# Patient Record
Sex: Female | Born: 1970
Health system: Southern US, Community
[De-identification: ages and names within clinical notes are randomized; demographics above are authoritative.]

## PROBLEM LIST (undated history)

## (undated) ENCOUNTER — Emergency Department (HOSPITAL_COMMUNITY): Admission: EM | Payer: 59 | Source: Home / Self Care

## (undated) DIAGNOSIS — G43909 Migraine, unspecified, not intractable, without status migrainosus: Secondary | ICD-10-CM

## (undated) DIAGNOSIS — I10 Essential (primary) hypertension: Secondary | ICD-10-CM

## (undated) HISTORY — PX: TONSILLECTOMY: SUR1361

## (undated) HISTORY — PX: APPENDECTOMY: SHX54

## (undated) HISTORY — PX: ABDOMINOPLASTY: SUR9

---

## 1998-08-09 ENCOUNTER — Other Ambulatory Visit: Admission: RE | Admit: 1998-08-09 | Discharge: 1998-08-09 | Payer: Self-pay | Admitting: Family Medicine

## 1998-12-03 ENCOUNTER — Encounter: Payer: Self-pay | Admitting: General Surgery

## 1998-12-03 ENCOUNTER — Observation Stay (HOSPITAL_COMMUNITY): Admission: EM | Admit: 1998-12-03 | Discharge: 1998-12-03 | Payer: Self-pay

## 1998-12-07 ENCOUNTER — Ambulatory Visit (HOSPITAL_COMMUNITY): Admission: RE | Admit: 1998-12-07 | Discharge: 1998-12-07 | Payer: Self-pay | Admitting: General Surgery

## 1998-12-07 ENCOUNTER — Encounter: Payer: Self-pay | Admitting: General Surgery

## 1999-09-21 ENCOUNTER — Other Ambulatory Visit: Admission: RE | Admit: 1999-09-21 | Discharge: 1999-09-21 | Payer: Self-pay | Admitting: *Deleted

## 2000-10-14 ENCOUNTER — Other Ambulatory Visit: Admission: RE | Admit: 2000-10-14 | Discharge: 2000-10-14 | Payer: Self-pay | Admitting: Internal Medicine

## 2002-01-01 ENCOUNTER — Other Ambulatory Visit: Admission: RE | Admit: 2002-01-01 | Discharge: 2002-01-01 | Payer: Self-pay | Admitting: Family Medicine

## 2002-11-16 ENCOUNTER — Other Ambulatory Visit: Admission: RE | Admit: 2002-11-16 | Discharge: 2002-11-16 | Payer: Self-pay | Admitting: Obstetrics & Gynecology

## 2003-09-14 ENCOUNTER — Other Ambulatory Visit: Admission: RE | Admit: 2003-09-14 | Discharge: 2003-09-14 | Payer: Self-pay | Admitting: Obstetrics & Gynecology

## 2004-08-28 ENCOUNTER — Other Ambulatory Visit: Admission: RE | Admit: 2004-08-28 | Discharge: 2004-08-28 | Payer: Self-pay | Admitting: Obstetrics & Gynecology

## 2005-04-20 ENCOUNTER — Inpatient Hospital Stay (HOSPITAL_COMMUNITY): Admission: AD | Admit: 2005-04-20 | Discharge: 2005-04-24 | Payer: Self-pay | Admitting: Obstetrics & Gynecology

## 2005-08-13 ENCOUNTER — Other Ambulatory Visit: Admission: RE | Admit: 2005-08-13 | Discharge: 2005-08-13 | Payer: Self-pay | Admitting: Obstetrics and Gynecology

## 2007-01-09 ENCOUNTER — Ambulatory Visit (HOSPITAL_COMMUNITY): Admission: RE | Admit: 2007-01-09 | Discharge: 2007-01-09 | Payer: Self-pay | Admitting: Obstetrics and Gynecology

## 2007-02-01 ENCOUNTER — Inpatient Hospital Stay (HOSPITAL_COMMUNITY): Admission: AD | Admit: 2007-02-01 | Discharge: 2007-02-02 | Payer: Self-pay | Admitting: Obstetrics and Gynecology

## 2007-02-23 ENCOUNTER — Inpatient Hospital Stay (HOSPITAL_COMMUNITY): Admission: AD | Admit: 2007-02-23 | Discharge: 2007-02-23 | Payer: Self-pay | Admitting: Obstetrics & Gynecology

## 2007-03-12 ENCOUNTER — Inpatient Hospital Stay (HOSPITAL_COMMUNITY): Admission: RE | Admit: 2007-03-12 | Discharge: 2007-03-14 | Payer: Self-pay | Admitting: Obstetrics and Gynecology

## 2008-04-15 ENCOUNTER — Encounter: Payer: Self-pay | Admitting: General Surgery

## 2008-04-15 ENCOUNTER — Ambulatory Visit (HOSPITAL_COMMUNITY): Admission: RE | Admit: 2008-04-15 | Discharge: 2008-04-15 | Payer: Self-pay | Admitting: General Surgery

## 2009-07-14 ENCOUNTER — Emergency Department (HOSPITAL_COMMUNITY): Admission: EM | Admit: 2009-07-14 | Discharge: 2009-07-15 | Payer: Self-pay | Admitting: Emergency Medicine

## 2010-02-24 ENCOUNTER — Emergency Department (HOSPITAL_BASED_OUTPATIENT_CLINIC_OR_DEPARTMENT_OTHER): Admission: EM | Admit: 2010-02-24 | Discharge: 2010-02-24 | Payer: Self-pay | Admitting: Emergency Medicine

## 2011-02-20 NOTE — Op Note (Signed)
NAMETENISHA, FLEECE               ACCOUNT NO.:  [id number]   MEDICAL RECORD NO.:  [id number]          PATIENT TYPE:  AMB   LOCATION:  SDS                          FACILITY:  [facility]   PHYSICIAN:  Cherylynn Ridges, M.D.    DATE OF BIRTH:  Mar 28, 1971   DATE OF PROCEDURE:  DATE OF DISCHARGE:                               OPERATIVE REPORT   PREOPERATIVE DIAGNOSIS:  Umbilical hernia.   POSTOPERATIVE DIAGNOSIS:  Umbilical hernia.   PROCEDURE:  Repair of umbilical hernia with UltraPro mesh.   SURGEON:  Cherylynn Ridges, MD   ANESTHESIA:  General with a laryngeal airway.   ESTIMATED BLOOD LOSS:  Less than 20 mL.   COMPLICATIONS:  None.   CONDITION:  Stable.   INDICATIONS FOR OPERATION:  This is a 40 year old with a symptomatic  umbilical hernia who comes in for repair.   OPERATION:  The patient was taken to the operating room and placed on  table in supine position.  After an adequate general laryngeal airway  anesthetic was administered, she was prepped and draped in usual sterile  manner exposing the midline and the periumbilical area.   A 5-6 cm long curved incision underneath the umbilicus was made using  #15 blade and taken down to the subcutaneous tissue.  The most difficult  and time-consuming aspect of this case was dissected away the hernia sac  from the umbilical skin, which was done with a Metzenbaum scissors.  A  small buttonhole was made in the periumbilical and the umbilical skin,  which was repaired internally with three simple stitches of 5-0 Vicryl.   We ultimately dissected the hernia sac away from the skin of the  umbilicus and then circumferentially opened it out into the  preperitoneal space.  We dissect the preperitoneal attachments and also  the omentum, which was inside the hernia away from the fascial edges.  The defect after it was opened completely was about 5 cm long.  Therefore, decision was made to repair with onlay mesh.   We initially repaired the  defect using interrupted simple stitches of #1  Novofil.  We then did a running back-and-forth stitch of 0 Prolene on  top of the interrupted stitch then attached an oval piece of mesh  measuring 3 x 6 cm long of UltraPro mesh, tacking it down with  interrupted stitches of 0 Prolene.  We also attached it in the middle  along the incision line using interrupted 0 Prolene.   Once this was done, we irrigated the wound with antibiotic solution.  The mesh had been soaked in prior to being implanted.  We then closed  the multiple layers and deep of 3-0 Vicryl layer.  We inverted the  umbilicus using 3-0  Vicryl, then we closed the skin after injecting the subcu with 10 mL of  Marcaine without epi.  We closed the skin using a running subcuticular  stitch of 4-0 Monocryl.  Dermabond, Steri-Strips, and Tegaderm were  applied as wound covering.  All counts were correct.      Cherylynn Ridges, M.D.  Electronically Signed  JOW/MEDQ  D:  04/15/2008  T:  04/16/2008  Job:  [id number]   cc:   Maxie Better, M.D.

## 2011-02-23 NOTE — Discharge Summary (Signed)
[name], Karen Dominguez               ACCOUNT NO.:  [id number]   MEDICAL RECORD NO.:  [id number]          PATIENT TYPE:  INP   LOCATION:  9148                          [facility]ILITY[facility]  [facility]   PHYSICIAN:  Miguel Aschoff, M.D.       DATE OF BIRTH:  11-30-1970   DATE OF ADMISSION:  04/20/2005  DATE OF DISCHARGE:  04/24/2005                                 DISCHARGE SUMMARY   FINAL DIAGNOSES:  1.  Intrauterine pregnancy at term.  2.  Cephalopelvic disproportion.   PROCEDURE:  Primary low transverse cesarean section. Surgeon:  Dr. Malva Limes. Complications:  None.   This 40 year old G2 P1 presents at [redacted] weeks gestation to [facility]  complaining of spontaneous rupture of membranes since 9:00 p.m. on April 20, 2005.  The patient's antepartum course at this point had been complicated by  advanced maternal age. She did have an amniocentesis which showed a normal  46,XX karyotype. Otherwise the patient's antepartum course had been  uncomplicated. She did have a negative group B strep culture obtained in the  office at 36 weeks. She was admitted at this time, was noted to be about 3  cm dilated. She dilated to complete and complete. She pushed for about 2  hours, was still about at the +2 station. The patient at this point was  offered forceps or vacuum and desired a cesarean section instead. The  patient was taken to the operating room on April 21, 2005 by Dr. Malva Limes where a primary low transverse cesarean section was performed with  the delivery of a 8-pound 1-ounce female infant with Apgars of 7 and 8.  Delivery went without complications. The patient's postoperative course was  benign without significant fevers. She was felt ready for discharge on  postoperative day #3. She was sent home on a regular diet, told to decrease  activities, told to continue her prenatal vitamins and her iron supplement,  was given Percocet one to two every 4 hours as needed for pain, told she  could use  over-the-counter Motrin up to 600 mg every 6 hours as needed for  pain, was to follow up in the office in 4 weeks.   LABORATORY ON DISCHARGE:  The patient had a hemoglobin of 8.9, white blood  cell count of 16.5, and platelets of 148,000.      [name], P.A.-C.      Miguel Aschoff, M.D.  Electronically Signed    MB/MEDQ  D:  05/24/2005  T:  05/24/2005  Job:  [id number]

## 2011-02-23 NOTE — Discharge Summary (Signed)
Karen Dominguez, Karen Dominguez               ACCOUNT NO.:  [id number]   MEDICAL RECORD NO.:  [id number]          PATIENT TYPE:  INP   LOCATION:  9128                          [facility]ACILITY[facility]  [facility]   PHYSICIAN:  Gerrit Friends. Aldona Bar, M.D.   DATE OF BIRTH:  [birth year]   DATE OF ADMISSION:  03/12/2007  DATE OF DISCHARGE:  03/14/2007                               DISCHARGE SUMMARY   DISCHARGE DIAGNOSIS:  1. 36 to 37-week intrauterine pregnancy, twins, delivered - twin A 6      pounds 1 ounce female, Apgars six and eight. Twin B 6 pounds even      female, Apgars 7/8.  2. Mild preeclampsia resolved.  3. History of prior cesarean section.   PROCEDURE:  Repeat low-transverse cesarean section.   SUMMARY:  This 40 year old female was admitted for delivery by repeat  cesarean section at 36-1/[redacted] weeks gestation because of mild preeclampsia.  Pregnancy to date has been uncomplicated.  She is followed very closely  with antenatal testing in the office.   She was taken to the OR on the day of admission, March 12, 2007, at which  time she was delivered of twins by repeat cesarean section.  Both babies  did well and were discharged with the mother on June 6.  On the morning  of June 6, mother with stable vital signs normal.  Hemoglobin on June 6  was 10.5 with a white count of 12,400, platelet count of 106,000.  On  the morning of June 6, she related flatus passage and actually had a  bowel movement although she still was slightly to moderately distended.  Her wound was clean and dry.  Her bleeding was minimal.  The patient was  really desirous of discharge and accordingly was given all appropriate  instructions and understood all instructions well.  She will return to  the office on June 9, for staple removal.   DISCHARGE MEDICATIONS:  1. Vitamins - one a day.  2. Ferrous sulfate 300 mg daily once bowel functions totally resumed      and normal.  3. Tylox 1-2 every 4-6 hours as needed for severe pain.  4. Motrin 600 mg  every 6 hours as needed for cramping or less severe      pain.   CONDITION ON DISCHARGE:  Improved.      Gerrit Friends. Aldona Bar, M.D.  Electronically Signed     RMW/MEDQ  D:  03/14/2007  T:  03/14/2007  Job:  [id number]

## 2011-02-23 NOTE — Op Note (Signed)
NAMESHERREY, NORTH               ACCOUNT NO.:  [id number]   MEDICAL RECORD NO.:  [id number]          PATIENT TYPE:  INP   LOCATION:  9199                          [facility]ACILITY:  [facility]   PHYSICIAN:  Malva Limes, M.D.    DATE OF BIRTH:  Oct 19, 1970   DATE OF PROCEDURE:  03/12/2007  DATE OF DISCHARGE:                               OPERATIVE REPORT   PREOPERATIVE DIAGNOSES:  1. Intrauterine twin pregnancy at 36-1/2 weeks' estimated gestational      age.  2. Pregnancy-induced hypertension.  3. History of prior cesarean section.  4. Patient desires repeat cesarean section.   POSTOPERATIVE DIAGNOSES:  1. Intrauterine twin pregnancy at 36-1/2 weeks' estimated gestational      age.  2. Pregnancy-induced hypertension.  3. History of prior cesarean section.  4. Patient desires repeat cesarean section.   PROCEDURE:  Repeat low transverse Cesarean section.   SURGEON:  Dareen Piano.   ASSISTANT:  Miguel Aschoff.   ANESTHESIA:  Spinal.   ANTIBIOTICS:  Ancef 1 gram.   DRAINS:  Foley to bedside drainage.   ESTIMATED BLOOD LOSS:  900 mL.   COMPLICATIONS:  None.   SPECIMENS:  None.   FINDINGS:  The patient delivered 1 live viable female infant as Twin A  and 1 live viable female infant as Twin B. Both were taken to newborn  nursery. The patient had normal fallopian tubes and ovaries bilaterally.   PROCEDURE:  The patient was taken to the operating room where spinal  anesthetic was administered without complication. She was then placed in  dorsal supine position with a left lateral tilt. She was prepped with  Betadine, and a Foley was placed. She was then draped in the usual  fashion for this procedure. A Pfannenstiel incision was made through the  previous scar. This was carried down to fascia. The fascia was then  entered in the midline and extended laterally with the Mayo scissors.  Rectus muscles were then dissected from the fascia. Rectus muscles were  divided in the midline and taken  superiorly and inferiorly. Parietal  peritoneum was entered sharply and taken superiorly and inferiorly.  Bladder flap was taken down sharply. A low transverse uterine incision  was made in the midline and extended laterally with blunt dissection.  Amniotic fluid in Twin A was noted to be clear. The patient delivered 1  live viable female infant in the vertex presentation. Cord doubly  clamped and cut, and the infant handed to the waiting NICU team. The sac  on Twin B was then ruptured. Again, the fluid was clear. This infant was  delivered in the breech presentation. On delivery of the head, the  oropharynx and nostrils were bulb suctioned, the cord doubly clamped and  cut, and the infant handed to the waiting NICU team. Placenta was then  manually removed. The uterus was exteriorized. The uterine cavity was  examined and found to be normal. The uterine incision was closed in a  single layer of 0 Monocryl in a running locking fashion. The bladder  flap was closed with 2-0 Monocryl in a running fashion. Uterus was  placed back in the abdominal cavity. Hemostasis was again checked and  found to be adequate. Parietal peritoneum and rectus muscles were  reapproximated in the midline using 2-0 Monocryl in a running fashion.  The fascia was closed using 0 Monocryl suture in a running fashion. The  subcuticular tissue was made hemostatic with a Bovie. Stainless steel  clips were used to close the skin. The patient tolerated the procedure  well. She was taken to the recovery room in stable condition. Instrument  and lap counts were correct x2.           ______________________________  Malva Limes, M.D.     MA/MEDQ  D:  03/12/2007  T:  03/12/2007  Job:  [id number]

## 2011-02-23 NOTE — Op Note (Signed)
Karen Dominguez, SHADD               ACCOUNT NO.:  [id number]   MEDICAL RECORD NO.:  [id number]          PATIENT TYPE:  INP   LOCATION:  9175                          [facility]ACILITY[facility]   PHYSICIAN:  Malva Limes, M.D.    DATE OF BIRTH:  1971-06-01   DATE OF PROCEDURE:  04/21/2005  DATE OF DISCHARGE:                                 OPERATIVE REPORT   PREOPERATIVE DIAGNOSES:  1.  Intrauterine pregnancy at term.  2.  Cephalopelvic disproportion.   POSTOPERATIVE DIAGNOSES:  1.  Intrauterine pregnancy at term.  2.  Cephalopelvic disproportion.   PROCEDURE:  Primary low transverse cesarean section.   SURGEON:  Malva Limes, M.D.   ANESTHESIA:  Epidural.   ANTIBIOTICS:  Ancef 1 g.   DRAINS:  Foley to bedside drainage.   ESTIMATED BLOOD LOSS:  900 mL.   COMPLICATIONS:  None.   SPECIMENS:  None.   DESCRIPTION OF PROCEDURE:  The patient was taken to the operating room,  where she was placed on operative table.  Once an adequate level was  reached, the patient was prepped with Betadine and draped in the usual  fashion for this procedure.  A Pfannenstiel incision was made.  This was  carried down to the fascia.  The fascia was entered in the midline and  extended laterally with the Mayo scissors.  The rectus muscles were then  dissected from the fascia with the Bovie.  Rectus muscles were divided in  the midline and taken superiorly and inferiorly.  The parietal peritoneum  was opened sharply.  The bladder flap was taken down sharply.  A low  transverse uterine incision was made in the midline and extended laterally  with blunt dissection.  Amniotic fluid was noted to be clear.  The infant  was delivered in the vertex presentation.  The cord was doubly clamped and  cut, and the infant handed to the awaiting NICU team.  Cord blood was then  obtained.  The placenta was manually removed.  The uterus was exteriorized.  The uterine cavity was wiped with a wet lap.  The uterine incision  was  closed in a single layer of 0 Monocryl suture in a running locking fashion.  The bladder flap was closed using 2-0 Monocryl in a running fashion.  The  uterus was placed back into the abdominal cavity.  Hemostasis was checked  and found to be adequate.  Ovaries and fallopian tubes appeared to be  normal.  Parietal peritoneum and rectus muscles are reapproximated in the  midline using 2-0 Monocryl in a running fashion.  The fascia was closed  using 0 Monocryl suture in a running  fashion.  Subcuticular tissue was made hemostatic with the Bovie.  Pfannenstiel clips were used to close the skin.  The patient tolerated the  procedure well, and she was taken to the recovery room in stable condition.  Instrument and lap counts were correct x 2.       MA/MEDQ  D:  04/21/2005  T:  04/21/2005  Job:  [id number]

## 2011-07-05 LAB — BASIC METABOLIC PANEL
CO2: 28
Chloride: 105
GFR calc non Af Amer: >60
Glucose, Bld: 97
Potassium: 3.2 — ABNORMAL LOW
Sodium: 138

## 2011-07-05 LAB — CBC
HCT: 32.2 — ABNORMAL LOW
Hemoglobin: 11.6 — ABNORMAL LOW
MCHC: 35.8
MCV: 92.9
RDW: 12.9

## 2011-07-26 LAB — URINALYSIS, ROUTINE W REFLEX MICROSCOPIC
Glucose, UA: NEGATIVE
Hgb urine dipstick: NEGATIVE
Protein, ur: NEGATIVE

## 2011-07-26 LAB — CBC
HCT: 30.4 — ABNORMAL LOW
Hemoglobin: 13
MCHC: 34.4
MCV: 98.7
RBC: 3.08 — ABNORMAL LOW
RDW: 13.6
WBC: 12.4 — ABNORMAL HIGH

## 2011-07-26 LAB — RPR: RPR Ser Ql: NONREACTIVE

## 2012-01-16 ENCOUNTER — Ambulatory Visit: Payer: Managed Care, Other (non HMO)

## 2012-01-16 ENCOUNTER — Ambulatory Visit (INDEPENDENT_AMBULATORY_CARE_PROVIDER_SITE_OTHER): Payer: Managed Care, Other (non HMO) | Admitting: Family Medicine

## 2012-01-16 VITALS — BP 127/86 | HR 68 | Temp 98.8°F | Resp 16 | Ht 61.5 in | Wt 122.2 lb

## 2012-01-16 DIAGNOSIS — R059 Cough, unspecified: Secondary | ICD-10-CM

## 2012-01-16 DIAGNOSIS — J40 Bronchitis, not specified as acute or chronic: Secondary | ICD-10-CM

## 2012-01-16 DIAGNOSIS — R05 Cough: Secondary | ICD-10-CM

## 2012-01-16 MED ORDER — AZITHROMYCIN 250 MG PO TABS: ORAL_TABLET | ORAL | Status: AC

## 2012-01-16 NOTE — Progress Notes (Signed)
41 yo woman sick for a month with sinus congestion.  Took amoxicillin after 1 week of sympotms, then took 5 days Levaquin and went to [employer].  She improved but never completely resolved, now worsening with wet productive violent cough.  She works in the ED as a Engineer, civil (consulting).  Some fatigue but no myalgias.  Sinuses fairly clear now.  O:  Thin adult women in NAD HEENT:  Unremarkable Chest:  Clear Heart:  Reg, rate about 72 Skin: warm and dry [facility] reading (PRIMARY) by  Dr. Milus Glazier.   A:

## 2014-02-04 ENCOUNTER — Other Ambulatory Visit: Payer: Self-pay | Admitting: Obstetrics and Gynecology

## 2014-02-04 DIAGNOSIS — R928 Other abnormal and inconclusive findings on diagnostic imaging of breast: Secondary | ICD-10-CM

## 2014-02-16 ENCOUNTER — Other Ambulatory Visit: Payer: Self-pay | Admitting: Obstetrics and Gynecology

## 2014-02-16 ENCOUNTER — Encounter (INDEPENDENT_AMBULATORY_CARE_PROVIDER_SITE_OTHER): Payer: Self-pay

## 2014-02-16 ENCOUNTER — Ambulatory Visit
Admission: RE | Admit: 2014-02-16 | Discharge: 2014-02-16 | Disposition: A | Discharge status: Home / Self Care | Payer: 59 | Source: Ambulatory Visit | Attending: Obstetrics and Gynecology | Admitting: Obstetrics and Gynecology

## 2014-02-16 DIAGNOSIS — R928 Other abnormal and inconclusive findings on diagnostic imaging of breast: Secondary | ICD-10-CM

## 2015-10-13 DIAGNOSIS — Z8371 Family history of colonic polyps: Secondary | ICD-10-CM | POA: Diagnosis not present

## 2015-10-13 DIAGNOSIS — K648 Other hemorrhoids: Secondary | ICD-10-CM | POA: Diagnosis not present

## 2015-10-13 DIAGNOSIS — Z800 Family history of malignant neoplasm of digestive organs: Secondary | ICD-10-CM | POA: Diagnosis not present

## 2015-11-07 MED FILL — PROMETHAZINE 25 MG TABLET: 25 | 2 days supply | Qty: 10 | Fill #0

## 2015-11-07 MED FILL — HYDROmorphone HCL 2 MG TABS: 2 | 1 days supply | Qty: 10 | Fill #0

## 2015-11-07 MED FILL — OxyCONTIN 15 MG T12A: 15 | 7 days supply | Qty: 14 | Fill #0

## 2015-11-07 MED FILL — METAXALONE 800 MG TABLET: 800 | 7 days supply | Qty: 21 | Fill #0

## 2015-11-22 MED FILL — METAXALONE 800 MG TABLET: 800 | 7 days supply | Qty: 21 | Fill #1

## 2015-11-24 MED FILL — HYDROCODON-APAP 5-325: 5-325 | 2 days supply | Qty: 15 | Fill #0

## 2015-12-30 DIAGNOSIS — R6882 Decreased libido: Secondary | ICD-10-CM | POA: Diagnosis not present

## 2016-01-03 MED FILL — EPIDUO GEL PUMP: 0.1-2.5 | 30 days supply | Qty: 45 | Fill #0

## 2016-01-06 MED FILL — VYVANSE 30 MG CAPSULE: 30 | 30 days supply | Qty: 30 | Fill #0

## 2016-02-07 DIAGNOSIS — Z6821 Body mass index (BMI) 21.0-21.9, adult: Secondary | ICD-10-CM | POA: Diagnosis not present

## 2016-02-07 DIAGNOSIS — Z01419 Encounter for gynecological examination (general) (routine) without abnormal findings: Secondary | ICD-10-CM | POA: Diagnosis not present

## 2016-02-07 DIAGNOSIS — Z1231 Encounter for screening mammogram for malignant neoplasm of breast: Secondary | ICD-10-CM | POA: Diagnosis not present

## 2016-02-07 DIAGNOSIS — Z124 Encounter for screening for malignant neoplasm of cervix: Secondary | ICD-10-CM | POA: Diagnosis not present

## 2016-03-27 DIAGNOSIS — D1800 Hemangioma unspecified site: Secondary | ICD-10-CM | POA: Diagnosis not present

## 2016-03-27 DIAGNOSIS — Z86018 Personal history of other benign neoplasm: Secondary | ICD-10-CM | POA: Diagnosis not present

## 2016-03-27 DIAGNOSIS — L814 Other melanin hyperpigmentation: Secondary | ICD-10-CM | POA: Diagnosis not present

## 2016-04-12 MED FILL — RIZATRIPTAN 5 MG ODT: 5 | 30 days supply | Qty: 9 | Fill #1

## 2016-04-12 MED FILL — CAMBIA 50 MG POWDER PACKET: 50 | 30 days supply | Qty: 9 | Fill #1

## 2016-04-12 MED FILL — EPIDUO GEL PUMP: 0.1-2.5 | 30 days supply | Qty: 45 | Fill #0

## 2016-07-19 MED FILL — RIZATRIPTAN 5 MG ODT: 5 | 30 days supply | Qty: 9 | Fill #2

## 2016-07-19 MED FILL — CAMBIA 50 MG POWDER PACKET: 50 | 30 days supply | Qty: 9 | Fill #2

## 2016-12-05 MED FILL — CAMBIA 50 MG POWDER PACKET: 50 | 30 days supply | Qty: 9 | Fill #0

## 2016-12-05 MED FILL — RIZATRIPTAN 5 MG ODT: 5 | 30 days supply | Qty: 9 | Fill #0

## 2017-01-14 DIAGNOSIS — R5383 Other fatigue: Secondary | ICD-10-CM | POA: Diagnosis not present

## 2017-01-14 DIAGNOSIS — F902 Attention-deficit hyperactivity disorder, combined type: Secondary | ICD-10-CM | POA: Diagnosis not present

## 2017-01-14 DIAGNOSIS — R002 Palpitations: Secondary | ICD-10-CM | POA: Diagnosis not present

## 2017-01-14 DIAGNOSIS — I10 Essential (primary) hypertension: Secondary | ICD-10-CM | POA: Diagnosis not present

## 2017-01-14 DIAGNOSIS — G43001 Migraine without aura, not intractable, with status migrainosus: Secondary | ICD-10-CM | POA: Diagnosis not present

## 2017-02-12 DIAGNOSIS — N926 Irregular menstruation, unspecified: Secondary | ICD-10-CM | POA: Diagnosis not present

## 2017-02-12 DIAGNOSIS — Z124 Encounter for screening for malignant neoplasm of cervix: Secondary | ICD-10-CM | POA: Diagnosis not present

## 2017-02-12 DIAGNOSIS — Z6821 Body mass index (BMI) 21.0-21.9, adult: Secondary | ICD-10-CM | POA: Diagnosis not present

## 2017-02-12 DIAGNOSIS — Z01419 Encounter for gynecological examination (general) (routine) without abnormal findings: Secondary | ICD-10-CM | POA: Diagnosis not present

## 2017-02-12 DIAGNOSIS — Z1231 Encounter for screening mammogram for malignant neoplasm of breast: Secondary | ICD-10-CM | POA: Diagnosis not present

## 2017-03-07 MED FILL — NORETHIND-ETH ESTRAD 1-0.02: 1-20 | 63 days supply | Qty: 63 | Fill #0

## 2017-03-07 MED FILL — CAMBIA 50 MG POWDER PACKET: 50 | 30 days supply | Qty: 9 | Fill #1

## 2017-03-07 MED FILL — MEDROXYPROGESTERONE 10 MG T: 10 | 5 days supply | Qty: 10 | Fill #0

## 2017-03-29 DIAGNOSIS — H5203 Hypermetropia, bilateral: Secondary | ICD-10-CM | POA: Diagnosis not present

## 2017-03-29 DIAGNOSIS — H52222 Regular astigmatism, left eye: Secondary | ICD-10-CM | POA: Diagnosis not present

## 2017-04-03 ENCOUNTER — Encounter (HOSPITAL_BASED_OUTPATIENT_CLINIC_OR_DEPARTMENT_OTHER): Payer: Self-pay

## 2017-04-03 ENCOUNTER — Emergency Department (HOSPITAL_BASED_OUTPATIENT_CLINIC_OR_DEPARTMENT_OTHER)
Admission: EM | Admit: 2017-04-03 | Discharge: 2017-04-03 | Disposition: A | Discharge status: Home / Self Care | Payer: 59 | Attending: Emergency Medicine | Admitting: Emergency Medicine

## 2017-04-03 DIAGNOSIS — G43809 Other migraine, not intractable, without status migrainosus: Secondary | ICD-10-CM | POA: Diagnosis not present

## 2017-04-03 DIAGNOSIS — Z87891 Personal history of nicotine dependence: Secondary | ICD-10-CM | POA: Insufficient documentation

## 2017-04-03 DIAGNOSIS — I10 Essential (primary) hypertension: Secondary | ICD-10-CM | POA: Diagnosis not present

## 2017-04-03 DIAGNOSIS — R51 Headache: Secondary | ICD-10-CM | POA: Diagnosis present

## 2017-04-03 DIAGNOSIS — G43909 Migraine, unspecified, not intractable, without status migrainosus: Secondary | ICD-10-CM | POA: Insufficient documentation

## 2017-04-03 DIAGNOSIS — Z79899 Other long term (current) drug therapy: Secondary | ICD-10-CM | POA: Insufficient documentation

## 2017-04-03 HISTORY — DX: Essential (primary) hypertension: I10

## 2017-04-03 HISTORY — DX: Migraine, unspecified, not intractable, without status migrainosus: G43.909

## 2017-04-03 MED ORDER — DIPHENHYDRAMINE HCL 50 MG/ML IJ SOLN
25.0000 mg | Freq: Once | INTRAMUSCULAR | Status: AC
  Administered 2017-04-03: 25 mg via INTRAVENOUS
  Filled 2017-04-03: qty 1

## 2017-04-03 MED ORDER — SODIUM CHLORIDE 0.9 % IV BOLUS (SEPSIS)
1000.0000 mL | Freq: Once | INTRAVENOUS | Status: AC
  Administered 2017-04-03: 1000 mL via INTRAVENOUS

## 2017-04-03 MED ORDER — PREDNISONE 20 MG PO TABS: 20.0000 mg | ORAL_TABLET | Freq: Two times a day (BID) | ORAL | 0 refills | Status: DC

## 2017-04-03 MED ORDER — METOCLOPRAMIDE HCL 5 MG/ML IJ SOLN
10.0000 mg | Freq: Once | INTRAMUSCULAR | Status: AC
  Administered 2017-04-03: 10 mg via INTRAVENOUS
  Filled 2017-04-03: qty 2

## 2017-04-03 MED ORDER — DEXAMETHASONE SODIUM PHOSPHATE 10 MG/ML IJ SOLN
10.0000 mg | Freq: Once | INTRAMUSCULAR | Status: AC
  Administered 2017-04-03: 10 mg via INTRAVENOUS
  Filled 2017-04-03: qty 1

## 2017-04-03 MED ORDER — VALPROATE SODIUM 500 MG/5ML IV SOLN
INTRAVENOUS | Status: AC
  Administered 2017-04-03: 500 mg
  Filled 2017-04-03: qty 5

## 2017-04-03 MED ORDER — DEXTROSE 5 % IV SOLN
500.0000 mg | Freq: Once | INTRAVENOUS | Status: DC
  Filled 2017-04-03: qty 5

## 2017-04-03 MED ORDER — KETOROLAC TROMETHAMINE 30 MG/ML IJ SOLN
30.0000 mg | Freq: Once | INTRAMUSCULAR | Status: AC
  Administered 2017-04-03: 30 mg via INTRAVENOUS
  Filled 2017-04-03: qty 1

## 2017-04-03 NOTE — Discharge Instructions (Signed)
Prednisone as needed for headache that persists.

## 2017-04-03 NOTE — ED Notes (Signed)
Pt verbalizes understanding of d/c instructions and denies any further needs at this time. 

## 2017-04-03 NOTE — ED Provider Notes (Signed)
[facility] DEPT [facility] Provider Note   CSN: [id number] Arrival date & time: 04/03/17  2124   By signing my name below, I, Evelene Croon, attest that this documentation has been prepared under the direction and in the presence of Tanna Furry, MD . Electronically Signed: Evelene Croon, Scribe. 04/03/2017. 10:06 PM.   History   Chief Complaint Chief Complaint  Patient presents with  . Migraine   The history is provided by the patient. No language interpreter was used.    HPI Comments:  Karen Dominguez is a 46 y.o. female with a history of migraine HA, who presents to the Emergency Department complaining of persistent HA that began yesterday AM. She describes pain to right temporal region. Pt reports associated photophobia. She states her cambia usually works for her HA but has not been providing adequate relief over the last 3 months. Pt notes recent change in vision; states the acuity in the right eye is now 20/40 instead of 20/20. She was written an Rx for glasses but she has not filled it. No nausea or numbness/weakness.    Past Medical History:  Diagnosis Date  . Hypertension   . Migraine     There are no active problems to display for this patient.   Past Surgical History:  Procedure Laterality Date  . APPENDECTOMY    . CESAREAN SECTION    . TONSILLECTOMY      OB History    No data available       Home Medications    Prior to Admission medications   Medication Sig Start Date End Date Taking? Authorizing Provider  Diclofenac Potassium (CAMBIA PO) Take by mouth.   Yes [provider]  hydrochlorothiazide (MICROZIDE) 12.5 MG capsule Take 12.5 mg by mouth daily.   Yes [provider]  RIZATRIPTAN BENZOATE PO Take by mouth.   Yes [provider]  predniSONE (DELTASONE) 20 MG tablet Take 1 tablet (20 mg total) by mouth 2 (two) times daily with a meal. 04/03/17   Tanna Furry, MD    Family History No family history on file.  Social  History Social History  Substance Use Topics  . Smoking status: Former Smoker    Years: 6.00  . Smokeless tobacco: Never Used  . Alcohol use Yes     Comment: rare     Allergies   Erythromycin   Review of Systems Review of Systems  Constitutional: Negative for appetite change, chills, diaphoresis, fatigue and fever.  HENT: Negative for mouth sores, sore throat and trouble swallowing.   Eyes: Positive for photophobia. Negative for visual disturbance.  Respiratory: Negative for cough, chest tightness, shortness of breath and wheezing.   Cardiovascular: Negative for chest pain.  Gastrointestinal: Negative for abdominal distention, abdominal pain, diarrhea, nausea and vomiting.  Endocrine: Negative for polydipsia, polyphagia and polyuria.  Genitourinary: Negative for dysuria, frequency and hematuria.  Musculoskeletal: Negative for gait problem.  Skin: Negative for color change, pallor and rash.  Neurological: Positive for headaches. Negative for dizziness, syncope and light-headedness.  Hematological: Does not bruise/bleed easily.  Psychiatric/Behavioral: Negative for behavioral problems and confusion.     Physical Exam Updated Vital Signs BP (!) 176/102 (BP Location: Left Arm)   Pulse 76   Temp 98.3 F (36.8 C) (Oral)   Resp 18   Ht 5\' 2"  (1.575 m)   Wt 53.5 kg (118 lb)   LMP 03/11/2017   SpO2 100%   BMI 21.58 kg/m   Physical Exam  Constitutional: She is oriented  to person, place, and time. She appears well-developed and well-nourished. No distress.  HENT:  Head: Normocephalic.  Eyes: Conjunctivae are normal. Pupils are equal, round, and reactive to light. No scleral icterus.  Neck: Normal range of motion. Neck supple. No thyromegaly present.  Cardiovascular: Normal rate and regular rhythm.  Exam reveals no gallop and no friction rub.   No murmur heard. Pulmonary/Chest: Effort normal and breath sounds normal. No respiratory distress. She has no wheezes. She has no  rales.  Abdominal: Soft. Bowel sounds are normal. She exhibits no distension. There is no tenderness. There is no rebound.  Musculoskeletal: Normal range of motion.  Neurological: She is alert and oriented to person, place, and time.  Skin: Skin is warm and dry. No rash noted.  Psychiatric: She has a normal mood and affect. Her behavior is normal.  Nursing note and vitals reviewed.    ED Treatments / Results  DIAGNOSTIC STUDIES:  Oxygen Saturation is 100% on RA, normal by my interpretation.    COORDINATION OF CARE:  10:05 PM Discussed treatment plan with pt at bedside and pt agreed to plan.  Labs (all labs ordered are listed, but only abnormal results are displayed) Labs Reviewed - No data to display  EKG  EKG Interpretation None       Radiology No results found.  Procedures Procedures (including critical care time)  Medications Ordered in ED Medications  valproate (DEPACON) 500 mg in dextrose 5 % 50 mL IVPB (not administered)  sodium chloride 0.9 % bolus 1,000 mL (1,000 mLs Intravenous New Bag/Given 04/03/17 2225)  ketorolac (TORADOL) 30 MG/ML injection 30 mg (30 mg Intravenous Given 04/03/17 2227)  diphenhydrAMINE (BENADRYL) injection 25 mg (25 mg Intravenous Given 04/03/17 2225)  metoCLOPramide (REGLAN) injection 10 mg (10 mg Intravenous Given 04/03/17 2220)  dexamethasone (DECADRON) injection 10 mg (10 mg Intravenous Given 04/03/17 2230)  valproate (DEPACON) 500 MG/5ML injection (500 mg  Given 04/03/17 2233)     Initial Impression / Assessment and Plan / ED Course  I have reviewed the triage vital signs and the nursing notes.  Pertinent labs & imaging results that were available during my care of the patient were reviewed by me and considered in my medical decision making (see chart for details).     On recheck at 23:04 pt states pain 3-4, and "better". finishing depakote. Will DC p meds. 48 hour rx bid prednisone.  Final Clinical Impressions(s) / ED Diagnoses     Final diagnoses:  Other migraine without status migrainosus, not intractable    New Prescriptions New Prescriptions   PREDNISONE (DELTASONE) 20 MG TABLET    Take 1 tablet (20 mg total) by mouth 2 (two) times daily with a meal.   I personally performed the services described in this documentation, which was scribed in my presence. The recorded information has been reviewed and is accurate.     Tanna Furry, MD 04/03/17 [phone]

## 2017-04-03 NOTE — ED Notes (Signed)
Ha onset yesterday  Denies n/v  meds that have helped in past not helping now

## 2017-04-03 NOTE — ED Triage Notes (Signed)
C/o migraine HA-started yesterday-NAD-steady gait-wearing sunglasses-denies n/v

## 2017-04-23 MED FILL — CAMBIA 50 MG POWDER PACKET: 50 | 30 days supply | Qty: 9 | Fill #2

## 2017-05-01 ENCOUNTER — Telehealth: Payer: Self-pay | Admitting: Family Medicine

## 2017-05-01 ENCOUNTER — Ambulatory Visit (HOSPITAL_COMMUNITY)
Admission: RE | Admit: 2017-05-01 | Discharge: 2017-05-01 | Disposition: A | Discharge status: Home / Self Care | Payer: 59 | Source: Ambulatory Visit | Attending: Sports Medicine | Admitting: Sports Medicine

## 2017-05-01 ENCOUNTER — Ambulatory Visit (INDEPENDENT_AMBULATORY_CARE_PROVIDER_SITE_OTHER): Payer: 59 | Admitting: Family Medicine

## 2017-05-01 ENCOUNTER — Encounter: Payer: Self-pay | Admitting: Family Medicine

## 2017-05-01 VITALS — BP 140/80 | Ht 62.0 in | Wt 118.0 lb

## 2017-05-01 DIAGNOSIS — M25561 Pain in right knee: Secondary | ICD-10-CM

## 2017-05-01 DIAGNOSIS — S8001XA Contusion of right knee, initial encounter: Secondary | ICD-10-CM | POA: Diagnosis not present

## 2017-05-01 DIAGNOSIS — R937 Abnormal findings on diagnostic imaging of other parts of musculoskeletal system: Secondary | ICD-10-CM | POA: Insufficient documentation

## 2017-05-01 NOTE — Progress Notes (Signed)
Chief complaint: Right knee pain 4 days  History of present illness: Karen Dominguez is a 46 year old Caucasian female, with past medical history notable for hypertension and migraine, who presents to the sports medicine office today with chief complaint of right knee pain. She reports that symptoms have been present for approximately 4 days now. She reports that 4 days ago she was on a slip and slide, going down hill at a high rate of speed, believes to slip and slide was about 40 feet in height, reports that she miscalculated the landing and landed straight with her leg in full extension, with a significant amount of axial load on the right knee. Sge does not report of any twisting or bending of the knee as she landed or right after she landed. She reports an immediate right knee pain and swelling. She reports pain on the medial aspect of her right knee. She reports any type of flexion or extension, more so with flexion though, aggravates the pain. She reports that she has been using ibuprofen 600 mg twice daily, with some improvement in symptoms. She also has been doing relative rest, ice, and elevation. She describes the pain today as a 6/10. She does not report of any radiation of pain. She does not report of any popping, locking, catching sensation. She reports continuing to have swelling and her right knee, reports that she has a hard time bearing weight on the right leg secondary to pain. She does not report of any numbness, tingling, or weakness in right lower extremity. She does not report of any previous right knee injury.   Past medical history: Hypertension Migraine  Past surgical history: Appendectomy C-section Tonsillectomy  Family history: No reported family history of hypertension, type 2 diabetes, hyperlipidemia on mother or father's side of family   Social history: Does report a former tobacco use, none currently, reports of occasional alcohol use, no illicit drug use, does work as a  Marine scientist in the emergency department, also a Environmental health practitioner  Allergies: Erythromycin  Review of systems: As stated above  Physical exam: Vital signs reviewed and are documented in chart General: Alert, oriented, appears stated age, in no apparent distress HEENT: Moist oral mucosa Respiratory: Normal respirations, able to speak in full sentences Cardiac: Normal peripheral pulses, regular rate Integumentary: No rashes on visible skin Neurologic: Strength 4+/5 in right knee, strength limited secondary to pain and right knee, otherwise strength 5/5 in bilateral lower extremity, sensation 2+ in bilateral lower extremity Psychiatric: Appropriate affect and mood Musculoskeletal: Inspection of right knee reveals obvious ballotable effusion, near 2+, otherwise, no obvious deformity or atrophy seen, slight warmth, slight ecchymosis over medial joint line, no erythema, she is tender to light palpation along medial joint line and medial aspect of proximal tibia, her range of motion is limited to 0 of flexion down to approximately 50 of flexion, secondary to pain being limiting factor, does not report of any mechanical symptoms causing no further increase of right knee flexion, Lachman, anterior drawer, valgus and varus stress testing negative, she does have firm endpoints, McMurray positive for pain, negative for crepitus  Assessment and plan: 1. Acute right knee pain  Acute right knee pain -Differential is broad at this point, most concerning at this point is tibial plateau fracture and medial meniscal tear, have lower suspicion of anterior cruciate ligament, MCL, PCL, and LCL involvement, she does have firm endpoints, as well as with clinical history with no twisting or bending of the knee when axial load occurred -  She will have x-rays obtained today, including AP, lateral, and oblique, to rule out tibial plateau fracture -Also discussed with her that she will need to have MRI right knee without  contrast to rule out medial meniscal tear -If she does have tibial plateau fracture, she will need to have further evaluation with CT imaging -Discussed she will need to be in a knee immobilizer, also provided crutches and Ace wrap -She reports that her next work shift is in 3 days, will provide light-duty letter for indefinite timeframe until further notice -Will prescribe meloxicam 7.5 mg twice daily for pain relief  Will call patient after x-ray and MRI results, with further plan of action to be determined by these results. Otherwise, she will return on an as-needed basis.   Mort Sawyers, MD Primary Care Sports Medicine Fellow [facility]

## 2017-05-01 NOTE — Telephone Encounter (Signed)
Spoke with patient over the phone regarding x-ray results, discussed there is no obvious bony abnormality, no evidence of tibial plateau fracture. X-ray demonstrated suprapatellar effusion that was noted on physical examination. She will have MRI scheduled for Saturday 05/04/17. Discussed to continue to be in knee immobilizer until further notice. She does understand and verbalize plan.

## 2017-05-04 ENCOUNTER — Ambulatory Visit (HOSPITAL_COMMUNITY)
Admission: RE | Admit: 2017-05-04 | Discharge: 2017-05-04 | Disposition: A | Discharge status: Home / Self Care | Payer: 59 | Source: Ambulatory Visit | Attending: Sports Medicine | Admitting: Sports Medicine

## 2017-05-04 DIAGNOSIS — M25561 Pain in right knee: Secondary | ICD-10-CM | POA: Insufficient documentation

## 2017-05-04 DIAGNOSIS — X58XXXA Exposure to other specified factors, initial encounter: Secondary | ICD-10-CM | POA: Diagnosis not present

## 2017-05-04 DIAGNOSIS — S82141A Displaced bicondylar fracture of right tibia, initial encounter for closed fracture: Secondary | ICD-10-CM | POA: Insufficient documentation

## 2017-05-04 DIAGNOSIS — S8991XA Unspecified injury of right lower leg, initial encounter: Secondary | ICD-10-CM | POA: Insufficient documentation

## 2017-05-04 DIAGNOSIS — S8001XA Contusion of right knee, initial encounter: Secondary | ICD-10-CM | POA: Insufficient documentation

## 2017-05-04 DIAGNOSIS — M25461 Effusion, right knee: Secondary | ICD-10-CM | POA: Insufficient documentation

## 2017-05-06 ENCOUNTER — Telehealth: Payer: Self-pay | Admitting: Sports Medicine

## 2017-05-06 ENCOUNTER — Other Ambulatory Visit: Payer: Self-pay

## 2017-05-06 DIAGNOSIS — S82141A Displaced bicondylar fracture of right tibia, initial encounter for closed fracture: Secondary | ICD-10-CM

## 2017-05-06 NOTE — Telephone Encounter (Signed)
I spoke with the patient on the phone today after reviewing the MRI of her right knee. She has an acute fracture of the lateral tibial plateau with small dye punch component anteriorly. It is minimally depressed. I've instructed her to remain nonweightbearing and I will refer her to [facility] for definitive treatment.

## 2017-05-07 ENCOUNTER — Ambulatory Visit (INDEPENDENT_AMBULATORY_CARE_PROVIDER_SITE_OTHER): Payer: 59 | Admitting: Orthopedic Surgery

## 2017-05-07 ENCOUNTER — Telehealth (INDEPENDENT_AMBULATORY_CARE_PROVIDER_SITE_OTHER): Payer: Self-pay

## 2017-05-07 ENCOUNTER — Encounter (INDEPENDENT_AMBULATORY_CARE_PROVIDER_SITE_OTHER): Payer: Self-pay | Admitting: Orthopedic Surgery

## 2017-05-07 VITALS — Ht 62.0 in | Wt 118.0 lb

## 2017-05-07 DIAGNOSIS — M84361A Stress fracture, right tibia, initial encounter for fracture: Secondary | ICD-10-CM

## 2017-05-07 NOTE — Telephone Encounter (Signed)
Pt needs a note stating that she is to be non weightbearing until the end of August. Fax to [phone] call with questions [phone]

## 2017-05-07 NOTE — Progress Notes (Signed)
   Office Visit Note   Patient: Karen Dominguez           Date of Birth: 1971/10/03           MRN: [id number] Visit Date: 05/07/2017              Requested by: Orpah Melter, MD [address] [place], [place] [address] PCP: Orpah Melter, MD  Chief Complaint  Patient presents with  . Right Knee - Pain    Lateral tibial plateau fracture MRI with Dr. Micheline Chapman      HPI: Patient is a 46 year old woman who states that she was camping she was going down a river slide when she struck the bottom with her right foot sustaining a valgus stress to her right knee. The patient was followed up with radiographs and an MRI scan patient is seen today in follow-up after the scan and radiographs were obtained.  Assessment & Plan: Visit Diagnoses:  1. Stress fracture of right tibia, initial encounter     Plan: Patient will be weightbearing as tolerated with crutches anti-inflammatories for pain control we will give her a hinged knee brace to help protect the MCL and allow for motion.  Follow-up in 3 weeks with 3 view radiographs of the right knee.  Follow-Up Instructions: No Follow-up on file.   Ortho Exam  Patient is alert, oriented, no adenopathy, well-dressed, normal affect, normal respiratory effort. Examination patient has an antalgic gait she is nonweightbearing with a knee immobilizer and crutches. Examination patient has tenderness to palpation at the origin of the MCL of the right knee. Valgus stress is stable no instability. Patient's medial and lateral joint lines are minimally tender to palpation she has no effusion she has range of motion from 0-90. Review of her MRI scan shows a stress fracture of the lateral tibial plateau there may be approximately a 1 mm displacement of a lateral depression fragment. There is no split of the tibial plateau plain radiographs shows no displacement. The MRI scan does show some edema around the MCL.  Imaging: No results found.  Labs: No  results found for: HGBA1C, ESRSEDRATE, CRP, LABURIC, REPTSTATUS, GRAMSTAIN, CULT, LABORGA  Orders:  No orders of the defined types were placed in this encounter.  No orders of the defined types were placed in this encounter.    Procedures: No procedures performed  Clinical Data: No additional findings.  ROS:  All other systems negative, except as noted in the HPI. Review of Systems  Objective: Vital Signs: Ht 5\' 2"  (1.575 m)   Wt 118 lb (53.5 kg)   BMI 21.58 kg/m   Specialty Comments:  No specialty comments available.  PMFS History: Patient Active Problem List   Diagnosis Date Noted  . Stress fracture of right tibia 05/07/2017   Past Medical History:  Diagnosis Date  . Hypertension   . Migraine     No family history on file.  Past Surgical History:  Procedure Laterality Date  . APPENDECTOMY    . CESAREAN SECTION    . TONSILLECTOMY     Social History   Occupational History  . Not on file.   Social History Main Topics  . Smoking status: Former Smoker    Years: 6.00  . Smokeless tobacco: Never Used  . Alcohol use Yes     Comment: rare  . Drug use: No  . Sexual activity: Not on file

## 2017-05-08 ENCOUNTER — Telehealth (INDEPENDENT_AMBULATORY_CARE_PROVIDER_SITE_OTHER): Payer: Self-pay | Admitting: Orthopedic Surgery

## 2017-05-08 ENCOUNTER — Other Ambulatory Visit (INDEPENDENT_AMBULATORY_CARE_PROVIDER_SITE_OTHER): Payer: Self-pay

## 2017-05-08 NOTE — Telephone Encounter (Signed)
Letter written and faxed.

## 2017-05-08 NOTE — Telephone Encounter (Signed)
PT ASKED IF YOU CAN REFAX HER WORK NOTE

## 2017-05-09 NOTE — Telephone Encounter (Signed)
This has been faxed again

## 2017-05-31 ENCOUNTER — Ambulatory Visit (INDEPENDENT_AMBULATORY_CARE_PROVIDER_SITE_OTHER): Payer: 59 | Admitting: Family

## 2017-05-31 ENCOUNTER — Encounter (INDEPENDENT_AMBULATORY_CARE_PROVIDER_SITE_OTHER): Payer: Self-pay | Admitting: Family

## 2017-05-31 ENCOUNTER — Ambulatory Visit (INDEPENDENT_AMBULATORY_CARE_PROVIDER_SITE_OTHER): Payer: Self-pay

## 2017-05-31 VITALS — Ht 62.0 in | Wt 118.0 lb

## 2017-05-31 DIAGNOSIS — M25561 Pain in right knee: Secondary | ICD-10-CM | POA: Diagnosis not present

## 2017-05-31 DIAGNOSIS — M84361D Stress fracture, right tibia, subsequent encounter for fracture with routine healing: Secondary | ICD-10-CM | POA: Diagnosis not present

## 2017-05-31 DIAGNOSIS — S83411D Sprain of medial collateral ligament of right knee, subsequent encounter: Secondary | ICD-10-CM | POA: Diagnosis not present

## 2017-05-31 DIAGNOSIS — G8929 Other chronic pain: Secondary | ICD-10-CM

## 2017-06-03 DIAGNOSIS — S83411A Sprain of medial collateral ligament of right knee, initial encounter: Secondary | ICD-10-CM | POA: Insufficient documentation

## 2017-06-03 NOTE — Progress Notes (Signed)
   Office Visit Note   Patient: Karen Dominguez           Date of Birth: 11/11/1970           MRN: [id number] Visit Date: 05/31/2017              Requested by: Orpah Melter, Belk Vazquez, [place] [address] PCP: Orpah Melter, MD  Chief Complaint  Patient presents with  . Right Knee - Follow-up    Lat tib-plat fx      HPI: Patient is a 46 year old woman who is seen today in follow up for a tibial plateau fracture and MCL strain to the right knee. States used crutches and was nonweight bearing with a Bledsoe on right for 30 days. For last 2 days has been attempting to bear weight.  Feels things going relatively well. Does complain of some stiffness to the right knee. Declined xrays today.   Assessment & Plan: Visit Diagnoses:  1. Stress fracture of right tibia with routine healing, subsequent encounter   2. Chronic pain of right knee   3. Sprain of medial collateral ligament of right knee, subsequent encounter     Plan: Patient will be weightbearing as tolerated with crutches anti-inflammatories for pain control we will give her a hinged knee brace to help protect the MCL and allow for motion.  Follow-up in 3 weeks with 3 view radiographs of the right knee.  Follow-Up Instructions: Return in about 2 weeks (around 06/14/2017).   Ortho Exam  Patient is alert, oriented, no adenopathy, well-dressed, normal affect, normal respiratory effort. Examination patient has an antalgic gait she is nonweightbearing with a knee immobilizer and crutches. Examination patient has tenderness to palpation of the MCL of the right knee. Valgus stress is stable no instability. Patient's medial and lateral joint lines are minimally tender to palpation she has no effusion she has range of motion from 0-90.   Imaging: No results found.  Labs: No results found for: HGBA1C, ESRSEDRATE, CRP, LABURIC, REPTSTATUS, GRAMSTAIN, CULT, LABORGA  Orders:  Orders Placed This Encounter    Procedures  . XR KNEE 3 VIEW RIGHT   No orders of the defined types were placed in this encounter.    Procedures: No procedures performed  Clinical Data: No additional findings.  ROS:  All other systems negative, except as noted in the HPI. Review of Systems  Objective: Vital Signs: Ht 5\' 2"  (1.575 m)   Wt 118 lb (53.5 kg)   BMI 21.58 kg/m   Specialty Comments:  No specialty comments available.  PMFS History: Patient Active Problem List   Diagnosis Date Noted  . Sprain of medial collateral ligament of right knee 06/03/2017  . Stress fracture of right tibia 05/07/2017   Past Medical History:  Diagnosis Date  . Hypertension   . Migraine     No family history on file.  Past Surgical History:  Procedure Laterality Date  . APPENDECTOMY    . CESAREAN SECTION    . TONSILLECTOMY     Social History   Occupational History  . Not on file.   Social History Main Topics  . Smoking status: Former Smoker    Years: 6.00  . Smokeless tobacco: Never Used  . Alcohol use Yes     Comment: rare  . Drug use: No  . Sexual activity: Not on file

## 2017-06-12 ENCOUNTER — Telehealth (INDEPENDENT_AMBULATORY_CARE_PROVIDER_SITE_OTHER): Payer: Self-pay | Admitting: Orthopedic Surgery

## 2017-06-12 ENCOUNTER — Encounter (INDEPENDENT_AMBULATORY_CARE_PROVIDER_SITE_OTHER): Payer: Self-pay | Admitting: Family

## 2017-06-12 NOTE — Telephone Encounter (Signed)
Patient called to let Dr. Cherylynn Ridges know that she is still in some pain and doesn't think she'll be able to go back to working 12 hours, and needs a continence on light duty. CB #  [phone]

## 2017-06-12 NOTE — Telephone Encounter (Signed)
Can you advise, and for how long?

## 2017-06-13 NOTE — Telephone Encounter (Signed)
Letter faxed to patient at [phone]. She is also wanting cd of mri done at [facility] recently on disc. Can you please burn this for the patient and call her once it is ready.

## 2017-06-14 NOTE — Telephone Encounter (Signed)
done

## 2017-06-20 DIAGNOSIS — S80862A Insect bite (nonvenomous), left lower leg, initial encounter: Secondary | ICD-10-CM | POA: Diagnosis not present

## 2017-06-20 DIAGNOSIS — Z23 Encounter for immunization: Secondary | ICD-10-CM | POA: Diagnosis not present

## 2017-06-20 DIAGNOSIS — W57XXXA Bitten or stung by nonvenomous insect and other nonvenomous arthropods, initial encounter: Secondary | ICD-10-CM | POA: Diagnosis not present

## 2017-06-20 DIAGNOSIS — M25551 Pain in right hip: Secondary | ICD-10-CM | POA: Diagnosis not present

## 2017-06-20 DIAGNOSIS — M25552 Pain in left hip: Secondary | ICD-10-CM | POA: Diagnosis not present

## 2017-06-24 MED FILL — CAMBIA 50 MG POWDER PACKET: 50 | 30 days supply | Qty: 9 | Fill #3

## 2017-06-27 ENCOUNTER — Ambulatory Visit (INDEPENDENT_AMBULATORY_CARE_PROVIDER_SITE_OTHER): Payer: 59

## 2017-06-27 ENCOUNTER — Ambulatory Visit (INDEPENDENT_AMBULATORY_CARE_PROVIDER_SITE_OTHER): Payer: 59 | Admitting: Family

## 2017-06-27 ENCOUNTER — Encounter (INDEPENDENT_AMBULATORY_CARE_PROVIDER_SITE_OTHER): Payer: Self-pay | Admitting: Family

## 2017-06-27 VITALS — Ht 62.0 in | Wt 118.0 lb

## 2017-06-27 DIAGNOSIS — S83411D Sprain of medial collateral ligament of right knee, subsequent encounter: Secondary | ICD-10-CM

## 2017-06-27 DIAGNOSIS — M84361D Stress fracture, right tibia, subsequent encounter for fracture with routine healing: Secondary | ICD-10-CM | POA: Diagnosis not present

## 2017-06-27 NOTE — Progress Notes (Signed)
   Office Visit Note   Patient: Karen Dominguez           Date of Birth: Jan 11, 1971           MRN: [id number] Visit Date: 06/27/2017              Requested by: Orpah Melter, MD [address] [place], [place] [address] PCP: Orpah Melter, MD  Chief Complaint  Patient presents with  . Right Knee - Follow-up    MCL sprain of right knee, right stress fracture of tibia.      HPI: Patient is a 46 year old woman who is seen today in follow up for a tibial plateau fracture and MCL strain to the right knee 05/01/17. Has been full weight bearing without assistive devices. Working on flexion and extension. Minimal discomfort in knee with ambulation, does have pain if on feet for prolonged periods of time. Disappointed in rom of knee, states doesn't have full extension or flexion.   Prefers to do HEP, no PT.  Assessment & Plan: Visit Diagnoses:  1. Sprain of medial collateral ligament of right knee, subsequent encounter   2. Stress fracture of right tibia with routine healing, subsequent encounter     Plan: Continue to advance weight bearing as tolerated. Provided a note for work, return. Discussed flexion and extension exercises if not making adequate progress will order PT. Follow up in office as needed.   Follow-Up Instructions: No Follow-up on file.   Ortho Exam  Patient is alert, oriented, no adenopathy, well-dressed, normal affect, normal respiratory effort. Examination patient has a steady gait. Examination patient has minimal tenderness to palpation of the MCL of the right knee. Valgus stress is stable no instability. Patient's medial and lateral joint lines are minimally tender to palpation. she has no effusion she has range of motion from 5-100. Imaging: No results found.  Labs: No results found for: HGBA1C, ESRSEDRATE, CRP, LABURIC, REPTSTATUS, GRAMSTAIN, CULT, LABORGA  Orders:  Orders Placed This Encounter  Procedures  . XR Knee 1-2 Views Right   No orders of  the defined types were placed in this encounter.    Procedures: No procedures performed  Clinical Data: No additional findings.  ROS:  All other systems negative, except as noted in the HPI. Review of Systems  Objective: Vital Signs: Ht 5\' 2"  (1.575 m)   Wt 118 lb (53.5 kg)   BMI 21.58 kg/m   Specialty Comments:  No specialty comments available.  PMFS History: Patient Active Problem List   Diagnosis Date Noted  . Sprain of medial collateral ligament of right knee 06/03/2017  . Stress fracture of right tibia 05/07/2017   Past Medical History:  Diagnosis Date  . Hypertension   . Migraine     History reviewed. No pertinent family history.  Past Surgical History:  Procedure Laterality Date  . APPENDECTOMY    . CESAREAN SECTION    . TONSILLECTOMY     Social History   Occupational History  . Not on file.   Social History Main Topics  . Smoking status: Former Smoker    Years: 6.00  . Smokeless tobacco: Never Used  . Alcohol use Yes     Comment: rare  . Drug use: No  . Sexual activity: Not on file

## 2017-08-01 MED FILL — CAMBIA 50 MG POWDER PACKET: 50 | 30 days supply | Qty: 9 | Fill #4

## 2017-08-27 DIAGNOSIS — R1013 Epigastric pain: Secondary | ICD-10-CM | POA: Diagnosis not present

## 2017-08-27 DIAGNOSIS — I10 Essential (primary) hypertension: Secondary | ICD-10-CM | POA: Diagnosis not present

## 2017-08-27 DIAGNOSIS — R079 Chest pain, unspecified: Secondary | ICD-10-CM | POA: Diagnosis not present

## 2017-10-14 DIAGNOSIS — R079 Chest pain, unspecified: Secondary | ICD-10-CM | POA: Diagnosis not present

## 2017-11-01 DIAGNOSIS — Z0000 Encounter for general adult medical examination without abnormal findings: Secondary | ICD-10-CM | POA: Diagnosis not present

## 2017-11-01 DIAGNOSIS — Z23 Encounter for immunization: Secondary | ICD-10-CM | POA: Diagnosis not present

## 2017-11-25 MED FILL — ADAPALENE 0.1% GEL: 0.1 | 30 days supply | Qty: 45 | Fill #0

## 2018-02-04 ENCOUNTER — Inpatient Hospital Stay (EMERGENCY_DEPARTMENT_HOSPITAL)
Admission: AD | Admit: 2018-02-04 | Discharge: 2018-02-04 | Disposition: A | Discharge status: Home / Self Care | Payer: BLUE CROSS/BLUE SHIELD | Source: Ambulatory Visit | Attending: Obstetrics and Gynecology | Admitting: Obstetrics and Gynecology

## 2018-02-04 ENCOUNTER — Other Ambulatory Visit: Payer: Self-pay

## 2018-02-04 ENCOUNTER — Encounter (HOSPITAL_COMMUNITY): Payer: Self-pay

## 2018-02-04 ENCOUNTER — Inpatient Hospital Stay (HOSPITAL_COMMUNITY): Payer: BLUE CROSS/BLUE SHIELD

## 2018-02-04 DIAGNOSIS — R55 Syncope and collapse: Secondary | ICD-10-CM | POA: Diagnosis not present

## 2018-02-04 DIAGNOSIS — D62 Acute posthemorrhagic anemia: Secondary | ICD-10-CM | POA: Diagnosis not present

## 2018-02-04 DIAGNOSIS — I10 Essential (primary) hypertension: Secondary | ICD-10-CM | POA: Diagnosis not present

## 2018-02-04 DIAGNOSIS — D500 Iron deficiency anemia secondary to blood loss (chronic): Secondary | ICD-10-CM | POA: Diagnosis not present

## 2018-02-04 DIAGNOSIS — N849 Polyp of female genital tract, unspecified: Secondary | ICD-10-CM | POA: Diagnosis not present

## 2018-02-04 DIAGNOSIS — R42 Dizziness and giddiness: Secondary | ICD-10-CM | POA: Diagnosis not present

## 2018-02-04 DIAGNOSIS — R404 Transient alteration of awareness: Secondary | ICD-10-CM | POA: Diagnosis not present

## 2018-02-04 DIAGNOSIS — N920 Excessive and frequent menstruation with regular cycle: Secondary | ICD-10-CM | POA: Diagnosis not present

## 2018-02-04 DIAGNOSIS — N939 Abnormal uterine and vaginal bleeding, unspecified: Secondary | ICD-10-CM

## 2018-02-04 DIAGNOSIS — Z87891 Personal history of nicotine dependence: Secondary | ICD-10-CM | POA: Diagnosis not present

## 2018-02-04 DIAGNOSIS — N840 Polyp of corpus uteri: Secondary | ICD-10-CM | POA: Diagnosis not present

## 2018-02-04 DIAGNOSIS — N926 Irregular menstruation, unspecified: Secondary | ICD-10-CM | POA: Diagnosis not present

## 2018-02-04 DIAGNOSIS — N938 Other specified abnormal uterine and vaginal bleeding: Secondary | ICD-10-CM | POA: Diagnosis not present

## 2018-02-04 DIAGNOSIS — D649 Anemia, unspecified: Secondary | ICD-10-CM | POA: Diagnosis not present

## 2018-02-04 LAB — CBC
HEMATOCRIT: 23.6 % — AB (ref 36.0–46.0)
Hemoglobin: 8.5 g/dL — ABNORMAL LOW (ref 12.0–15.0)
MCH: 32.7 pg (ref 26.0–34.0)
MCHC: 36 g/dL (ref 30.0–36.0)
MCV: 90.8 fL (ref 78.0–100.0)
Platelets: 168 10*3/uL (ref 150–400)
RBC: 2.6 MIL/uL — ABNORMAL LOW (ref 3.87–5.11)
RDW: 13.2 % (ref 11.5–15.5)
WBC: 6 10*3/uL (ref 4.0–10.5)

## 2018-02-04 LAB — ABO/RH: ABO/RH(D): O POS

## 2018-02-04 LAB — WET PREP, GENITAL
CLUE CELLS WET PREP: NONE SEEN
SPERM: NONE SEEN
Trich, Wet Prep: NONE SEEN
Yeast Wet Prep HPF POC: NONE SEEN

## 2018-02-04 MED ORDER — SODIUM CHLORIDE 0.9 % IV SOLN
INTRAVENOUS | Status: DC
  Administered 2018-02-04: 17:00:00 via INTRAVENOUS

## 2018-02-04 MED ORDER — ONDANSETRON 8 MG PO TBDP
8.0000 mg | ORAL_TABLET | Freq: Once | ORAL | Status: AC
  Administered 2018-02-04: 8 mg via ORAL
  Filled 2018-02-04: qty 1

## 2018-02-04 MED ORDER — LACTATED RINGERS IV SOLN: INTRAVENOUS | Status: DC

## 2018-02-04 MED ORDER — ACETAMINOPHEN 500 MG PO TABS
1000.0000 mg | ORAL_TABLET | Freq: Once | ORAL | Status: AC
Start: 2018-02-04 — End: 2018-02-04
  Administered 2018-02-04: 1000 mg via ORAL
  Filled 2018-02-04: qty 2

## 2018-02-04 MED ORDER — TRANEXAMIC ACID 1000 MG/10ML IV SOLN
1000.0000 mg | Freq: Once | INTRAVENOUS | Status: AC
  Administered 2018-02-04: 1000 mg via INTRAVENOUS
  Filled 2018-02-04: qty 1100

## 2018-02-04 NOTE — Discharge Instructions (Signed)
Call the office and be seen in the next couple of days. Continue your iron supplement and take a stool softener also. Call your doctor if your bleeding is severe again. Drink at least 8 8-oz glasses of water every day. Rest frequently and eat healthy meals.

## 2018-02-04 NOTE — MAU Provider Note (Signed)
History     CSN: [id number]  Arrival date and time: 02/04/18 1200   First Provider Initiated Contact with Patient 02/04/18 1256      Chief Complaint  Patient presents with  . Vaginal Bleeding   HPI Karen Dominguez 47 y.o.   Comes to MAU by EMS as she had a syncopal episode at home.  Was hypotensive in ambulance and was given 1 L of IVF.   Has a headache.  Has had irregular menses for the past 6 months and would bleed for 1-2 weeks and then stop.  For the past week she has been bleeding heavily with less than fist sized clots changing her pads approx every hour.  Had an appointment in the office today, but when she was attempting to take a shower to get ready, she had a syncopal episode and EMS was called.  She did not hit her head and does not have any bruising.  She is not hurting anywhere.  She has not eaten today or had her usual coffee today as she was feeling very tired and took a nap before her shower.  She wants the bleeding to stop.  She has been having chills and is under 4 blankets and sheets - she has her eyes closed and the room is darkened.  Currently she has a saline lock.   OB History   None     Past Medical History:  Diagnosis Date  . Hypertension   . Migraine     Past Surgical History:  Procedure Laterality Date  . APPENDECTOMY    . CESAREAN SECTION    . TONSILLECTOMY      History reviewed. No pertinent family history.  Social History   Tobacco Use  . Smoking status: Former Smoker    Years: 6.00  . Smokeless tobacco: Never Used  Substance Use Topics  . Alcohol use: Yes    Comment: rare  . Drug use: No    Allergies:  Allergies  Allergen Reactions  . Erythromycin     Medications Prior to Admission  Medication Sig Dispense Refill Last Dose  . Diclofenac Potassium (CAMBIA PO) Take by mouth.   Past Week at Unknown time  . doxycycline (MONODOX) 100 MG capsule    Taking  . hydrochlorothiazide (MICROZIDE) 12.5 MG capsule Take 12.5 mg by mouth  daily.   Taking  . meloxicam (MOBIC) 15 MG tablet    Taking  . predniSONE (DELTASONE) 20 MG tablet Take 1 tablet (20 mg total) by mouth 2 (two) times daily with a meal. 4 tablet 0 Taking  . RIZATRIPTAN BENZOATE PO Take by mouth.   Taking    Review of Systems  Constitutional: Positive for chills. Negative for fever.  Gastrointestinal: Negative for abdominal pain, nausea and vomiting.  Genitourinary: Positive for vaginal bleeding. Negative for pelvic pain and vaginal discharge.  Neurological: Positive for syncope, weakness and headaches.   Physical Exam   Blood pressure 117/81, pulse 68, temperature 97.8 F (36.6 C), temperature source Oral, resp. rate 18, SpO2 100 %.  Physical Exam  Nursing note and vitals reviewed. Constitutional: She is oriented to person, place, and time. She appears well-developed and well-nourished.  HENT:  Head: Normocephalic.  Eyes: EOM are normal.  Neck: Neck supple.  GI: Soft. There is no tenderness. There is no rebound and no guarding.  Genitourinary:  Genitourinary Comments: Speculum exam: Vagina - Small amount of white discharge, no odor Cervix - No contact bleeding Bimanual exam: Cervix closed Uterus non  tender, normal size Adnexa non tender, no masses bilaterally GC/Chlam, wet prep done Chaperone present for exam.   Musculoskeletal: Normal range of motion.  Neurological: She is alert and oriented to person, place, and time.  Skin: Skin is warm and dry.  Psychiatric: She has a normal mood and affect.   Patient Vitals for the past 24 hrs:  BP Temp Temp src Pulse Resp SpO2  02/04/18 1412 129/84 - - 95 - -  02/04/18 1411 - - - - - 100 %  02/04/18 1409 133/88 - - 87 - -  02/04/18 1408 138/87 - - 92 - -  02/04/18 1407 125/73 - - 73 - -  02/04/18 1406 - - - - - 100 %  02/04/18 1220 - - - - - 100 %  02/04/18 1213 117/81 97.8 F (36.6 C) Oral 68 18 100 %     MAU Course  Procedures Results for orders placed or performed during the hospital  encounter of 02/04/18 (from the past 24 hour(s))  CBC     Status: Abnormal   Collection Time: 02/04/18  1:09 PM  Result Value Ref Range   WBC 6.0 4.0 - 10.5 K/uL   RBC 2.60 (L) 3.87 - 5.11 MIL/uL   Hemoglobin 8.5 (L) 12.0 - 15.0 g/dL   HCT 23.6 (L) 36.0 - 46.0 %   MCV 90.8 78.0 - 100.0 fL   MCH 32.7 26.0 - 34.0 pg   MCHC 36.0 30.0 - 36.0 g/dL   RDW 13.2 11.5 - 15.5 %   Platelets 168 150 - 400 K/uL  Type and screen Roland     Status: None   Collection Time: 02/04/18  1:09 PM  Result Value Ref Range   ABO/RH(D) O POS    Antibody Screen NEG    Sample Expiration      02/07/2018 Performed at [facility], [address] [place], [place] [address]   ABO/Rh     Status: None   Collection Time: 02/04/18  1:09 PM  Result Value Ref Range   ABO/RH(D)      O POS Performed at [facility], [place], [place] [address]   Wet prep, genital     Status: Abnormal   Collection Time: 02/04/18  1:45 PM  Result Value Ref Range   Yeast Wet Prep HPF POC NONE SEEN NONE SEEN   Trich, Wet Prep NONE SEEN NONE SEEN   Clue Cells Wet Prep HPF POC NONE SEEN NONE SEEN   WBC, Wet Prep HPF POC FEW (A) NONE SEEN   Sperm NONE SEEN     MDM Consult with Dr. Ouida Sills Given IVF Normal Saline 1000cc and Lysteda 1 GM IV given in MAU.   Given Tylenol 1000cc for headache in MAU. Client able to get up to the bathroom with minimal assistance.  Has been sitting up in bed drinking ginger ale.  No chills at present.  Will have her husband with her at home tonight.  Her bleeding is much less - minimal at present.  Discussed discharge instructions with client.  She reports she is feeling much better and wants to go home.  Able to get dressed for discharge.  Assessment and Plan  Abnormal vaginal bleeding -  Slightly enlarged uterus with suspected adenmyosis on ultrasound Anemia  Plan Call the office and be seen in the next couple of days. Continue your iron supplement and  take a stool softener also. Call your doctor if your bleeding is severe  again. Drink at least 8 8-oz glasses of water every day. Rest frequently and eat healthy meals.  Terri L Burleson 02/04/2018, 1:49 PM

## 2018-02-04 NOTE — MAU Note (Signed)
Pt presents to MAU via EMS for vaginal bleeding x 1 week. Pt has soaked 1 pad every half hour to hour. Pt had syncopal episode this morning in the shower. EMS reported a hypotensive blood pressure in route.

## 2018-02-05 ENCOUNTER — Observation Stay (EMERGENCY_DEPARTMENT_HOSPITAL)
Admission: AD | Admit: 2018-02-05 | Discharge: 2018-02-05 | Disposition: A | Discharge status: Home / Self Care | Payer: BLUE CROSS/BLUE SHIELD | Source: Ambulatory Visit | Attending: Obstetrics and Gynecology | Admitting: Obstetrics and Gynecology

## 2018-02-05 ENCOUNTER — Inpatient Hospital Stay (HOSPITAL_COMMUNITY): Payer: BLUE CROSS/BLUE SHIELD | Admitting: Certified Registered Nurse Anesthetist

## 2018-02-05 ENCOUNTER — Ambulatory Visit (HOSPITAL_COMMUNITY)
Admission: RE | Admit: 2018-02-05 | Payer: BLUE CROSS/BLUE SHIELD | Source: Ambulatory Visit | Admitting: Obstetrics and Gynecology

## 2018-02-05 ENCOUNTER — Encounter (HOSPITAL_COMMUNITY): Payer: Self-pay

## 2018-02-05 ENCOUNTER — Encounter (HOSPITAL_COMMUNITY)
Admission: AD | Disposition: A | Discharge status: Home / Self Care | Payer: Self-pay | Source: Ambulatory Visit | Attending: Obstetrics and Gynecology

## 2018-02-05 ENCOUNTER — Other Ambulatory Visit: Payer: Self-pay | Admitting: Obstetrics and Gynecology

## 2018-02-05 ENCOUNTER — Other Ambulatory Visit: Payer: Self-pay

## 2018-02-05 DIAGNOSIS — N920 Excessive and frequent menstruation with regular cycle: Principal | ICD-10-CM | POA: Diagnosis present

## 2018-02-05 DIAGNOSIS — N849 Polyp of female genital tract, unspecified: Secondary | ICD-10-CM | POA: Diagnosis not present

## 2018-02-05 DIAGNOSIS — D649 Anemia, unspecified: Secondary | ICD-10-CM

## 2018-02-05 DIAGNOSIS — N840 Polyp of corpus uteri: Secondary | ICD-10-CM | POA: Diagnosis not present

## 2018-02-05 DIAGNOSIS — R42 Dizziness and giddiness: Secondary | ICD-10-CM

## 2018-02-05 DIAGNOSIS — Z87891 Personal history of nicotine dependence: Secondary | ICD-10-CM

## 2018-02-05 DIAGNOSIS — N926 Irregular menstruation, unspecified: Secondary | ICD-10-CM | POA: Diagnosis not present

## 2018-02-05 DIAGNOSIS — I10 Essential (primary) hypertension: Secondary | ICD-10-CM | POA: Diagnosis present

## 2018-02-05 DIAGNOSIS — D62 Acute posthemorrhagic anemia: Secondary | ICD-10-CM | POA: Diagnosis present

## 2018-02-05 HISTORY — PX: DILITATION & CURRETTAGE/HYSTROSCOPY WITH NOVASURE ABLATION: SHX5568

## 2018-02-05 LAB — CBC
HCT: 18.3 % — ABNORMAL LOW (ref 36.0–46.0)
Hemoglobin: 6.5 g/dL — CL (ref 12.0–15.0)
MCH: 32.5 pg (ref 26.0–34.0)
MCHC: 35.5 g/dL (ref 30.0–36.0)
MCV: 91.5 fL (ref 78.0–100.0)
PLATELETS: 179 10*3/uL (ref 150–400)
RBC: 2 MIL/uL — ABNORMAL LOW (ref 3.87–5.11)
RDW: 13.6 % (ref 11.5–15.5)
WBC: 4.7 10*3/uL (ref 4.0–10.5)

## 2018-02-05 LAB — GC/CHLAMYDIA PROBE AMP (~~LOC~~) NOT AT ARMC
Chlamydia: NEGATIVE
NEISSERIA GONORRHEA: NEGATIVE

## 2018-02-05 LAB — PREPARE RBC (CROSSMATCH)

## 2018-02-05 SURGERY — DILATATION & CURETTAGE/HYSTEROSCOPY WITH NOVASURE ABLATION
Anesthesia: General | Site: Vagina

## 2018-02-05 MED ORDER — ONDANSETRON HCL 4 MG/2ML IJ SOLN
INTRAMUSCULAR | Status: DC | PRN
  Administered 2018-02-05: 4 mg via INTRAVENOUS

## 2018-02-05 MED ORDER — MIDAZOLAM HCL 2 MG/2ML IJ SOLN
INTRAMUSCULAR | Status: AC
  Filled 2018-02-05: qty 2

## 2018-02-05 MED ORDER — HYDROMORPHONE HCL 1 MG/ML IJ SOLN: 0.2500 mg | INTRAMUSCULAR | Status: DC | PRN

## 2018-02-05 MED ORDER — ALBUMIN HUMAN 5 % IV SOLN
INTRAVENOUS | Status: DC | PRN
  Administered 2018-02-05: 15:00:00 via INTRAVENOUS

## 2018-02-05 MED ORDER — PROMETHAZINE HCL 25 MG/ML IJ SOLN: 6.2500 mg | INTRAMUSCULAR | Status: DC | PRN

## 2018-02-05 MED ORDER — MIDAZOLAM HCL 2 MG/2ML IJ SOLN
INTRAMUSCULAR | Status: DC | PRN
  Administered 2018-02-05: 1 mg via INTRAVENOUS

## 2018-02-05 MED ORDER — ONDANSETRON HCL 4 MG/2ML IJ SOLN
INTRAMUSCULAR | Status: AC
  Filled 2018-02-05: qty 2

## 2018-02-05 MED ORDER — PROPOFOL 10 MG/ML IV BOLUS
INTRAVENOUS | Status: AC
  Filled 2018-02-05: qty 20

## 2018-02-05 MED ORDER — LACTATED RINGERS IV SOLN
INTRAVENOUS | Status: DC | PRN
  Administered 2018-02-05: 15:00:00 via INTRAVENOUS

## 2018-02-05 MED ORDER — OXYCODONE HCL 5 MG/5ML PO SOLN: 5.0000 mg | Freq: Once | ORAL | Status: DC | PRN

## 2018-02-05 MED ORDER — MEPERIDINE HCL 25 MG/ML IJ SOLN: 6.2500 mg | INTRAMUSCULAR | Status: DC | PRN

## 2018-02-05 MED ORDER — KETOROLAC TROMETHAMINE 30 MG/ML IJ SOLN
INTRAMUSCULAR | Status: AC
  Filled 2018-02-05: qty 1

## 2018-02-05 MED ORDER — FENTANYL CITRATE (PF) 100 MCG/2ML IJ SOLN
INTRAMUSCULAR | Status: AC
  Filled 2018-02-05: qty 2

## 2018-02-05 MED ORDER — LIDOCAINE HCL (CARDIAC) PF 100 MG/5ML IV SOSY
PREFILLED_SYRINGE | INTRAVENOUS | Status: DC | PRN
  Administered 2018-02-05: 60 mg via INTRAVENOUS

## 2018-02-05 MED ORDER — DEXAMETHASONE SODIUM PHOSPHATE 4 MG/ML IJ SOLN
INTRAMUSCULAR | Status: AC
  Filled 2018-02-05: qty 1

## 2018-02-05 MED ORDER — LIDOCAINE HCL 1 % IJ SOLN
INTRAMUSCULAR | Status: AC
  Filled 2018-02-05: qty 20

## 2018-02-05 MED ORDER — CEFAZOLIN SODIUM-DEXTROSE 2-3 GM-%(50ML) IV SOLR
INTRAVENOUS | Status: DC | PRN
  Administered 2018-02-05: 2 g via INTRAVENOUS

## 2018-02-05 MED ORDER — PROPOFOL 10 MG/ML IV BOLUS
INTRAVENOUS | Status: DC | PRN
  Administered 2018-02-05: 160 mg via INTRAVENOUS

## 2018-02-05 MED ORDER — LIDOCAINE HCL 1 % IJ SOLN
INTRAMUSCULAR | Status: DC | PRN
  Administered 2018-02-05: 10 mL

## 2018-02-05 MED ORDER — DEXAMETHASONE SODIUM PHOSPHATE 10 MG/ML IJ SOLN
INTRAMUSCULAR | Status: DC | PRN
  Administered 2018-02-05: 4 mg via INTRAVENOUS

## 2018-02-05 MED ORDER — FAMOTIDINE IN NACL 20-0.9 MG/50ML-% IV SOLN
20.0000 mg | Freq: Once | INTRAVENOUS | Status: AC
  Administered 2018-02-05: 20 mg via INTRAVENOUS
  Filled 2018-02-05: qty 50

## 2018-02-05 MED ORDER — FENTANYL CITRATE (PF) 100 MCG/2ML IJ SOLN
25.0000 ug | INTRAMUSCULAR | Status: DC | PRN
  Administered 2018-02-05 (×2): 50 ug via INTRAVENOUS

## 2018-02-05 MED ORDER — LACTATED RINGERS IV SOLN
INTRAVENOUS | Status: DC
  Administered 2018-02-05: 17:00:00 via INTRAVENOUS

## 2018-02-05 MED ORDER — OXYCODONE HCL 5 MG PO TABS: 5.0000 mg | ORAL_TABLET | Freq: Once | ORAL | Status: DC | PRN

## 2018-02-05 MED ORDER — LACTATED RINGERS IR SOLN
Status: DC | PRN
  Administered 2018-02-05: 3000 mL

## 2018-02-05 MED ORDER — SODIUM CHLORIDE 0.9 % IV BOLUS
1000.0000 mL | Freq: Once | INTRAVENOUS | Status: AC
  Administered 2018-02-05: 1000 mL via INTRAVENOUS

## 2018-02-05 MED ORDER — SUCCINYLCHOLINE CHLORIDE 20 MG/ML IJ SOLN
INTRAMUSCULAR | Status: DC | PRN
  Administered 2018-02-05: 80 mg via INTRAVENOUS

## 2018-02-05 MED ORDER — LIDOCAINE HCL (CARDIAC) PF 100 MG/5ML IV SOSY
PREFILLED_SYRINGE | INTRAVENOUS | Status: AC
  Filled 2018-02-05: qty 5

## 2018-02-05 MED ORDER — SOD CITRATE-CITRIC ACID 500-334 MG/5ML PO SOLN
30.0000 mL | Freq: Once | ORAL | Status: AC
  Administered 2018-02-05: 30 mL via ORAL
  Filled 2018-02-05: qty 15

## 2018-02-05 MED ORDER — FENTANYL CITRATE (PF) 100 MCG/2ML IJ SOLN
INTRAMUSCULAR | Status: DC | PRN
  Administered 2018-02-05: 100 ug via INTRAVENOUS

## 2018-02-05 MED ORDER — PHENYLEPHRINE HCL 10 MG/ML IJ SOLN
INTRAMUSCULAR | Status: DC | PRN
  Administered 2018-02-05: 80 ug via INTRAVENOUS
  Administered 2018-02-05: 40 ug via INTRAVENOUS
  Administered 2018-02-05: 80 ug via INTRAVENOUS

## 2018-02-05 SURGICAL SUPPLY — 12 items
ABLATOR ENDOMETRIAL BIPOLAR (ABLATOR) ×2 IMPLANT
CANISTER SUCT 3000ML PPV (MISCELLANEOUS) ×2 IMPLANT
CATH ROBINSON RED A/P 16FR (CATHETERS) ×2 IMPLANT
GLOVE BIOGEL PI IND STRL 7.0 (GLOVE) ×1 IMPLANT
GLOVE BIOGEL PI INDICATOR 7.0 (GLOVE) ×1
GLOVE ECLIPSE 7.0 STRL STRAW (GLOVE) ×2 IMPLANT
GOWN STRL REUS W/TWL LRG LVL3 (GOWN DISPOSABLE) ×4 IMPLANT
PACK VAGINAL MINOR WOMEN LF (CUSTOM PROCEDURE TRAY) ×2 IMPLANT
PAD OB MATERNITY 4.3X12.25 (PERSONAL CARE ITEMS) ×2 IMPLANT
TOWEL OR 17X24 6PK STRL BLUE (TOWEL DISPOSABLE) ×4 IMPLANT
TUBING AQUILEX INFLOW (TUBING) ×2 IMPLANT
TUBING AQUILEX OUTFLOW (TUBING) ×2 IMPLANT

## 2018-02-05 NOTE — Transfer of Care (Signed)
Immediate Anesthesia Transfer of Care Note  Patient: Karen Dominguez  Procedure(s) Performed: DILATATION & CURETTAGE/HYSTEROSCOPY WITH NOVASURE ABLATION (N/A Vagina )  Patient Location: PACU  Anesthesia Type:General  Level of Consciousness: awake, alert , oriented and patient cooperative  Airway & Oxygen Therapy: Patient Spontanous Breathing and Patient connected to nasal cannula oxygen  Post-op Assessment: Report given to RN and Post -op Vital signs reviewed and stable  Post vital signs: Reviewed and stable  Last Vitals:  Vitals Value Taken Time  BP 109/55 02/05/2018  3:25 PM  Temp    Pulse 90 02/05/2018  3:28 PM  Resp 12 02/05/2018  3:28 PM  SpO2 100 % 02/05/2018  3:28 PM  Vitals shown include unvalidated device data.  Last Pain:  Vitals:   02/05/18 1224  TempSrc: Oral  PainSc: 2          Complications: No apparent anesthesia complications

## 2018-02-05 NOTE — Anesthesia Procedure Notes (Signed)
Procedure Name: Intubation Date/Time: 02/05/2018 2:53 PM Performed by: Raenette Rover, CRNA Pre-anesthesia Checklist: Patient identified, Emergency Drugs available, Suction available and Patient being monitored Patient Re-evaluated:Patient Re-evaluated prior to induction Oxygen Delivery Method: Circle system utilized Preoxygenation: Pre-oxygenation with 100% oxygen Induction Type: IV induction, Rapid sequence and Cricoid Pressure applied Laryngoscope Size: Mac and 3 Grade View: Grade I Tube type: Oral Tube size: 7.0 mm Number of attempts: 1 Airway Equipment and Method: Stylet Placement Confirmation: ETT inserted through vocal cords under direct vision,  positive ETCO2,  CO2 detector and breath sounds checked- equal and bilateral Secured at: 20 cm Tube secured with: Tape Dental Injury: Teeth and Oropharynx as per pre-operative assessment

## 2018-02-05 NOTE — Anesthesia Postprocedure Evaluation (Signed)
Anesthesia Post Note  Patient: Karen Dominguez  Procedure(s) Performed: DILATATION & CURETTAGE/HYSTEROSCOPY WITH NOVASURE ABLATION (N/A Vagina )     Patient location during evaluation: PACU Anesthesia Type: General Level of consciousness: awake and alert Pain management: pain level controlled Vital Signs Assessment: post-procedure vital signs reviewed and stable Respiratory status: spontaneous breathing, nonlabored ventilation and respiratory function stable Cardiovascular status: blood pressure returned to baseline and stable Postop Assessment: no apparent nausea or vomiting Anesthetic complications: no    Last Vitals:  Vitals:   02/05/18 1630 02/05/18 1645  BP: 110/72   Pulse: 74   Resp: 10 10  Temp:    SpO2: 100%     Last Pain:  Vitals:   02/05/18 1645  TempSrc:   PainSc: 3    Pain Goal: Patients Stated Pain Goal: 3 (02/05/18 1645)               Lynda Rainwater

## 2018-02-05 NOTE — MAU Note (Signed)
Pt presents to MAU with c/o a large amount of vaginal bleeding that started a week ago and has had 2 syncopal in the last two days.

## 2018-02-05 NOTE — H&P (Signed)
Karen Dominguez is an 47 y.o. G73P2013 white female who presents to the ER with presistent heavy vaginal bleeding x 10 days.She states that for the last 6 months she has had heavier menses. She This menses is heavier than nl. She was seen yesterday in the ER after a syncopal episode. She had a HGB of 8.5. She was not orthostatic in the ER . She was given LYsteda 1gm IV and set up for surgery today. Discussed the possible need for a blood transfusion. She prefers not to receive blood if possible. Her u/s yesterday did not reveal any fibroids or polyps. There is a susp of adenomyosis.     Past Medical History:  Diagnosis Date  . Hypertension   . Migraine     Past Surgical History:  Procedure Laterality Date  . APPENDECTOMY    . CESAREAN SECTION    . TONSILLECTOMY      History reviewed. No pertinent family history. Social History:  reports that she has quit smoking. She quit after 6.00 years of use. She has never used smokeless tobacco. She reports that she drinks alcohol. She reports that she does not use drugs.  Allergies:  Allergies  Allergen Reactions  . Erythromycin Palpitations    Medications Prior to Admission  Medication Sig Dispense Refill  . Diclofenac Potassium 50 MG PACK Take 50 mg by mouth daily as needed (for pain).         Blood pressure (!) 85/57, pulse 69, temperature 97.7 F (36.5 C), temperature source Oral, resp. rate 18, SpO2 100 %. General appearance: alert and cooperative Abdomen: soft, non-tender; bowel sounds normal; no masses,  no organomegaly   Lab Results  Component Value Date   WBC 4.7 02/05/2018   HGB 6.5 (LL) 02/05/2018   HCT 18.3 (L) 02/05/2018   MCV 91.5 02/05/2018   PLT 179 02/05/2018   No results found for: PREGTESTUR, PREGSERUM, HCG, HCGQUANT    Patient Active Problem List   Diagnosis Date Noted  . Sprain of medial collateral ligament of right knee 06/03/2017  . Stress fracture of right tibia 05/07/2017   IMP/ menorrhagia   Severe anemia Plan/ Proceed to the OR for hysteroscopy/D&C/ Ablation  Forest E 02/05/2018, 2:16 PM

## 2018-02-05 NOTE — Progress Notes (Signed)
Dr Ouida Sills at bedside talking with patient.

## 2018-02-05 NOTE — Anesthesia Preprocedure Evaluation (Signed)
Anesthesia Evaluation  Patient identified by MRN, date of birth, ID band Patient awake    Reviewed: Allergy & Precautions, NPO status , Patient's Chart, lab work & pertinent test results  Airway Mallampati: II  TM Distance: >3 FB Neck ROM: Full    Dental no notable dental hx.    Pulmonary neg pulmonary ROS, former smoker,    Pulmonary exam normal breath sounds clear to auscultation       Cardiovascular hypertension, Pt. on medications negative cardio ROS Normal cardiovascular exam Rhythm:Regular Rate:Normal     Neuro/Psych negative neurological ROS  negative psych ROS   GI/Hepatic negative GI ROS, Neg liver ROS,   Endo/Other  negative endocrine ROS  Renal/GU negative Renal ROS  negative genitourinary   Musculoskeletal negative musculoskeletal ROS (+)   Abdominal   Peds negative pediatric ROS (+)  Hematology negative hematology ROS (+) anemia ,   Anesthesia Other Findings   Reproductive/Obstetrics negative OB ROS                             Anesthesia Physical Anesthesia Plan  ASA: II and emergent  Anesthesia Plan: General   Post-op Pain Management:    Induction: Intravenous, Rapid sequence and Cricoid pressure planned  PONV Risk Score and Plan: 3 and Ondansetron, Dexamethasone and Midazolam  Airway Management Planned: Oral ETT  Additional Equipment:   Intra-op Plan:   Post-operative Plan: Extubation in OR  Informed Consent: I have reviewed the patients History and Physical, chart, labs and discussed the procedure including the risks, benefits and alternatives for the proposed anesthesia with the patient or authorized representative who has indicated his/her understanding and acceptance.   Dental advisory given  Plan Discussed with: CRNA  Anesthesia Plan Comments:         Anesthesia Quick Evaluation

## 2018-02-05 NOTE — Discharge Instructions (Signed)
°  Post Anesthesia Home Care Instructions  Activity: Get plenty of rest for the remainder of the day. A responsible individual must stay with you for 24 hours following the procedure.  For the next 24 hours, DO NOT: -Drive a car -Paediatric nurse -Drink alcoholic beverages -Take any medication unless instructed by your physician -Make any legal decisions or sign important papers.  Meals: Start with liquid foods such as gelatin or soup. Progress to regular foods as tolerated. Avoid greasy, spicy, heavy foods. If nausea and/or vomiting occur, drink only clear liquids until the nausea and/or vomiting subsides. Call your physician if vomiting continues.  Special Instructions/Symptoms: Your throat may feel dry or sore from the anesthesia or the breathing tube placed in your throat during surgery. If this causes discomfort, gargle with warm salt water. The discomfort should disappear within 24 hours.    DISCHARGE INSTRUCTIONS: HYSTEROSCOPY / ENDOMETRIAL ABLATION The following instructions have been prepared to help you care for yourself upon your return home.  May take stool softner while taking narcotic pain medication to prevent constipation.  Drink plenty of water.  Do not drive while on percocet pain med.  Personal hygiene:  Use sanitary pads for vaginal drainage, not tampons.  Shower the day after your procedure.  NO tub baths, pools or Jacuzzis for 2-3 weeks.  Wipe front to back after using the bathroom.  Activity and limitations:  Do NOT drive or operate any equipment for 24 hours. The effects of anesthesia are still present and drowsiness may result.  Do NOT rest in bed all day.  Walking is encouraged.  Walk up and down stairs slowly.  You may resume your normal activity in one to two days or as indicated by your physician.  Sexual activity: NO intercourse for at least 2 weeks after the procedure, or as indicated by your Doctor.  Diet: Eat a light meal as desired  this evening. You may resume your usual diet tomorrow.  Return to Work: You may resume your work activities in one to two days or as indicated by Marine scientist.  What to expect after your surgery: Expect to have vaginal bleeding/discharge for 2-3 days and spotting for up to 10 days. It is not unusual to have soreness for up to 1-2 weeks. You may have a slight burning sensation when you urinate for the first day. Mild cramps may continue for a couple of days. You may have a regular period in 2-6 weeks.  Call your doctor for any of the following:  Excessive vaginal bleeding or clotting, saturating and changing one pad every hour.  Inability to urinate 6 hours after discharge from hospital.  Pain not relieved by pain medication.  Fever of 100.4 F or greater.  Unusual vaginal discharge or odor.  Return to office _________________Call for an appointment ___________________ Patients signature: ______________________ Nurses signature ________________________  [place] [facility] [phone]

## 2018-02-05 NOTE — MAU Provider Note (Signed)
History     CSN: [id number]  Arrival date and time: 02/05/18 1208   First Provider Initiated Contact with Patient 02/05/18 1259      Chief Complaint  Patient presents with  . Vaginal Bleeding  . Dizziness   HPI    Ms.Karen Dominguez is a 47 y.o. female G65P2013 non pregnant female here in MAU with complaints of heavy vaginal bleeding. She is scheduled for an ablation today at 1600 with Dr. Ouida Sills. Says she was up in the bathroom today and became very dizzy and passed out. Says her bleeding is ok if she is laying down, when she stands she passes large clots and the bleeding is extremely heavy. Says she could not wait to be seen today due to how dizzy she is. Patient's husband is present with her at the bedside.   OB History    Gravida  3   Para  2   Term  2   Preterm      AB  1   Living  3     SAB      TAB      Ectopic      Multiple  1   Live Births              Past Medical History:  Diagnosis Date  . Hypertension   . Migraine     Past Surgical History:  Procedure Laterality Date  . APPENDECTOMY    . CESAREAN SECTION    . TONSILLECTOMY      History reviewed. No pertinent family history.  Social History   Tobacco Use  . Smoking status: Former Smoker    Years: 6.00  . Smokeless tobacco: Never Used  Substance Use Topics  . Alcohol use: Yes    Comment: rare  . Drug use: No    Allergies:  Allergies  Allergen Reactions  . Erythromycin Palpitations    Medications Prior to Admission  Medication Sig Dispense Refill Last Dose  . Diclofenac Potassium 50 MG PACK Take 50 mg by mouth daily as needed (for pain).   Past Week at Unknown time   Results for orders placed or performed during the hospital encounter of 02/04/18 (from the past 48 hour(s))  CBC     Status: Abnormal   Collection Time: 02/04/18  1:09 PM  Result Value Ref Range   WBC 6.0 4.0 - 10.5 K/uL   RBC 2.60 (L) 3.87 - 5.11 MIL/uL   Hemoglobin 8.5 (L) 12.0 - 15.0 g/dL   HCT 23.6  (L) 36.0 - 46.0 %   MCV 90.8 78.0 - 100.0 fL   MCH 32.7 26.0 - 34.0 pg   MCHC 36.0 30.0 - 36.0 g/dL   RDW 13.2 11.5 - 15.5 %   Platelets 168 150 - 400 K/uL    Comment: Performed at [facility], [address], [place], [place] [address]  Type and screen Ingenio     Status: None   Collection Time: 02/04/18  1:09 PM  Result Value Ref Range   ABO/RH(D) O POS    Antibody Screen NEG    Sample Expiration      02/07/2018 Performed at [facility], [address] [place], [place] [address]   ABO/Rh     Status: None   Collection Time: 02/04/18  1:09 PM  Result Value Ref Range   ABO/RH(D)      O POS Performed at [facility], [address] [place], [place] [address]  Wet prep, genital     Status: Abnormal   Collection Time: 02/04/18  1:45 PM  Result Value Ref Range   Yeast Wet Prep HPF POC NONE SEEN NONE SEEN   Trich, Wet Prep NONE SEEN NONE SEEN   Clue Cells Wet Prep HPF POC NONE SEEN NONE SEEN   WBC, Wet Prep HPF POC FEW (A) NONE SEEN    Comment: FEW BACTERIA SEEN   Sperm NONE SEEN     Comment: Performed at [facility], [address] [place], [place] [address]   Review of Systems  Constitutional: Positive for activity change and fatigue.  Genitourinary: Positive for vaginal bleeding.  Neurological: Positive for dizziness, weakness and light-headedness.   Physical Exam   Blood pressure (!) 85/57, pulse 69, temperature 97.7 F (36.5 C), temperature source Oral, resp. rate 18, SpO2 100 %.  Physical Exam  Constitutional: She is oriented to person, place, and time. She appears well-developed.  HENT:  Head: Normocephalic.  Cardiovascular: Normal rate.  GI: Soft.  Genitourinary:  Genitourinary Comments: Moderate amount of bright red blood on pad Cervix FT, anterior  Uterus enlarged Chaperone present for exam.   Neurological: She is alert and oriented to person, place, and time.  Skin: Skin is warm. There is pallor.   MAU Course   Procedures  None  MDM  Type and Crossmatch  Orthostatic vitals with signs of orthostasis  Fall risk applied CBC pending NS bolus Dr. Ouida Sills notified. CBC pending. Dr. Ouida Sills to resume care.   Assessment and Plan   A:  Symptomatic anemia Heavy vaginal bleeding   P:  OR today Dr. Ouida Sills to write additional orders Patient stable at this time  Rasch, Artist Pais, NP 02/07/2018 9:48 AM

## 2018-02-06 ENCOUNTER — Encounter (HOSPITAL_COMMUNITY): Payer: Self-pay | Admitting: Obstetrics and Gynecology

## 2018-02-06 ENCOUNTER — Inpatient Hospital Stay (HOSPITAL_COMMUNITY)
Admission: AD | Admit: 2018-02-06 | Discharge: 2018-02-07 | DRG: 744 | Disposition: A | Discharge status: Home / Self Care | Payer: BLUE CROSS/BLUE SHIELD | Source: Ambulatory Visit | Attending: Obstetrics and Gynecology | Admitting: Obstetrics and Gynecology

## 2018-02-06 DIAGNOSIS — I10 Essential (primary) hypertension: Secondary | ICD-10-CM | POA: Diagnosis present

## 2018-02-06 DIAGNOSIS — N920 Excessive and frequent menstruation with regular cycle: Secondary | ICD-10-CM | POA: Diagnosis present

## 2018-02-06 DIAGNOSIS — D62 Acute posthemorrhagic anemia: Secondary | ICD-10-CM | POA: Diagnosis present

## 2018-02-06 DIAGNOSIS — D649 Anemia, unspecified: Secondary | ICD-10-CM | POA: Diagnosis present

## 2018-02-06 DIAGNOSIS — D500 Iron deficiency anemia secondary to blood loss (chronic): Secondary | ICD-10-CM

## 2018-02-06 DIAGNOSIS — N840 Polyp of corpus uteri: Secondary | ICD-10-CM | POA: Diagnosis present

## 2018-02-06 DIAGNOSIS — Z87891 Personal history of nicotine dependence: Secondary | ICD-10-CM | POA: Diagnosis not present

## 2018-02-06 LAB — URINALYSIS, ROUTINE W REFLEX MICROSCOPIC
BACTERIA UA: NONE SEEN
Bilirubin Urine: NEGATIVE
GLUCOSE, UA: NEGATIVE mg/dL
Ketones, ur: NEGATIVE mg/dL
LEUKOCYTES UA: NEGATIVE
Nitrite: NEGATIVE
PROTEIN: NEGATIVE mg/dL
Specific Gravity, Urine: 1.001 — ABNORMAL LOW (ref 1.005–1.030)
pH: 7 (ref 5.0–8.0)

## 2018-02-06 LAB — HEMOGLOBIN AND HEMATOCRIT, BLOOD
HCT: 16.6 % — ABNORMAL LOW (ref 36.0–46.0)
HEMOGLOBIN: 5.8 g/dL — AB (ref 12.0–15.0)

## 2018-02-06 LAB — TYPE AND SCREEN
ABO/RH(D): O POS
ABO/RH(D): O POS
ANTIBODY SCREEN: NEGATIVE
Antibody Screen: NEGATIVE
UNIT DIVISION: 0
Unit division: 0

## 2018-02-06 LAB — BPAM RBC
BLOOD PRODUCT EXPIRATION DATE: 201905222359
Blood Product Expiration Date: 201905262359
UNIT TYPE AND RH: 5100
Unit Type and Rh: 5100

## 2018-02-06 LAB — PREPARE RBC (CROSSMATCH)

## 2018-02-06 MED ORDER — PRENATAL MULTIVITAMIN CH
1.0000 | ORAL_TABLET | Freq: Every day | ORAL | Status: DC
  Filled 2018-02-06: qty 1

## 2018-02-06 MED ORDER — SODIUM CHLORIDE 0.9 % IV SOLN
Freq: Once | INTRAVENOUS | Status: AC
  Administered 2018-02-06: 21:00:00 via INTRAVENOUS

## 2018-02-06 MED ORDER — DIPHENHYDRAMINE HCL 25 MG PO CAPS
25.0000 mg | ORAL_CAPSULE | Freq: Once | ORAL | Status: AC
  Administered 2018-02-06: 25 mg via ORAL
  Filled 2018-02-06: qty 1

## 2018-02-06 NOTE — MAU Provider Note (Signed)
Patient Karen Dominguez is a 47 y.o. [phone] At Unknown here for blood transfusion.  History     CSN: [id number]  Arrival date and time: 02/06/18 1851   None     Chief Complaint  Patient presents with  . Dizziness   Dizziness  Associated symptoms include fatigue. The symptoms are aggravated by exertion. She has tried eating and lying down for the symptoms.  Patient has had steady bleeding since last week, had uterine ablation on 5-1.  Did not have blood transfusion despite Hgb of 6.5. Today she felt extremely weak and could not hold a cup to push fluids as her doctor recommended. She called her doctor and then came in to be seen.   OB History    Gravida  3   Para  2   Term  2   Preterm      AB  1   Living  3     SAB      TAB      Ectopic      Multiple  1   Live Births              Past Medical History:  Diagnosis Date  . Hypertension   . Migraine     Past Surgical History:  Procedure Laterality Date  . APPENDECTOMY    . CESAREAN SECTION    . DILITATION & CURRETTAGE/HYSTROSCOPY WITH NOVASURE ABLATION N/A 02/05/2018   Procedure: DILATATION & CURETTAGE/HYSTEROSCOPY WITH NOVASURE ABLATION;  Surgeon: Olga Millers, MD;  Location: [facility];  Service: Gynecology;  Laterality: N/A;  . TONSILLECTOMY      History reviewed. No pertinent family history.  Social History   Tobacco Use  . Smoking status: Former Smoker    Years: 6.00  . Smokeless tobacco: Never Used  Substance Use Topics  . Alcohol use: Yes    Comment: rare  . Drug use: No    Allergies:  Allergies  Allergen Reactions  . Erythromycin Palpitations    Medications Prior to Admission  Medication Sig Dispense Refill Last Dose  . Diclofenac Potassium 50 MG PACK Take 50 mg by mouth daily as needed (for pain).   Past Week at Unknown time  . hydrochlorothiazide (MICROZIDE) 12.5 MG capsule Take 12.5 mg by mouth daily.   Past Week at Unknown time  . IRON PO Take by mouth daily.   02/05/2018 at  0900  . Omega-3 Fatty Acids (FISH OIL PO) Take 2 capsules by mouth daily.   Past Week at Unknown time  . OVER THE COUNTER MEDICATION Take 1 capsule by mouth daily.   Past Week at Unknown time    Review of Systems  Constitutional: Positive for fatigue.  HENT: Negative.   Respiratory: Negative.   Cardiovascular: Negative.   Gastrointestinal: Negative.   Genitourinary: Negative.   Skin: Negative.   Neurological: Positive for dizziness.  Hematological: Negative.   Psychiatric/Behavioral: Negative.    Physical Exam   Blood pressure 123/73, pulse 73, temperature 98.8 F (37.1 C), temperature source Oral, resp. rate 18.  Physical Exam  Constitutional: She is oriented to person, place, and time. She appears well-developed.  HENT:  Head: Normocephalic.  Eyes: Pupils are equal, round, and reactive to light.  Neck: Normal range of motion.  GI: Soft.  Musculoskeletal: Normal range of motion.  Neurological: She is alert and oriented to person, place, and time.  Skin: Skin is warm.  Psychiatric: She has a normal mood and affect.  Patient is pale-appearing and  lethargic, although has normal mood, affect and is alert and oriented x 3.   MAU Course  Procedures  MDM -Will admit to 3rd floor and do 24 hour observation.  -orders placed; Dr. Ouida Sills in agreement with plan.   Assessment and Plan   1. Iron deficiency anemia due to chronic blood loss    2. Patient to be admitted to 3rd Floor for blood transfusion and 24 hour observation.  Mervyn Skeeters Zikeria Keough 02/06/2018, 8:15 PM

## 2018-02-06 NOTE — MAU Note (Signed)
CRITICAL VALUE ALERT  Critical Value:  Hgb 5.8  Date & Time Notified:  02/06/2018 1953  Provider Notified: Maye Hides CNM  Orders Received/Actions taken: none at this time

## 2018-02-06 NOTE — MAU Note (Signed)
Pt reports she has a uterine ablation yesterday. Stated she had felt very weak and dizzy. Feels her pulse in her ears. Trying to hydrate but is too weak to even hold cup up. Hemoglobin was 6.5 yesterday prior to procedure

## 2018-02-06 NOTE — Op Note (Signed)
NAME: Karen, Dominguez MEDICAL RECORD NO:[id number] ACCOUNT [id number] DATE OF BIRTH:24-Nov-1970 FACILITY: [facility] LOCATION: [facility] PHYSICIAN:MARK Kathline Magic, MD  OPERATIVE REPORT  DATE OF PROCEDURE:  02/05/2018  PREOPERATIVE DIAGNOSES: 1.  Menorrhagia. 2.  Severe menorrhea.  POSTOPERATIVE DIAGNOSES: 1.  Menorrhagia. 2.  Severe menorrhea. 3.  Extensive endometrial polyps.  SURGEON:  Freda Munro, MD  ANESTHESIA:  General.  ANTIBIOTICS:  Ancef 2 g  DRAINS:  Red rubber catheter to bladder.  SPECIMENS:  Endometrial curettings and polyps sent to pathology.  ESTIMATED BLOOD LOSS:  Minimal.  FINDINGS:  The endocervical canal appeared to be normal.  The uterine cavity was filled with multiple endometrial polyps.  DESCRIPTION OF PROCEDURE:  The patient was taken to the operating room where she was placed in the dorsal supine position.  A general anesthetic was administered without difficulty.  She was then placed in the dorsal lithotomy position.  She was prepped  and draped in the usual fashion for this procedure.  A sterile speculum was placed in the vagina.  Then, 20 mL of 1% lidocaine was used for a paracervical block.  A single-tooth tenaculum was applied to the anterior cervical lip.  The uterus was sounded  to 9 cm.  The cervical os was serially dilated to a 27-French.  The hysteroscope was placed through the endocervical canal, which appeared to be normal.  On entering the uterine cavity, the patient was noted to have a few blood clots with several uterine  polyps.  At this point, polyp forceps were used to remove the polyps along with a sharp curettage.  Because of the patient's extensive vaginal bleeding and her hemoglobin now at 6.5, it was felt that something definitive needed to be accomplished to  stop her bleeding.  She does have a family history of colon cancer which raised the suspicion of Lynch syndrome.  However, the amount of significant bleeding in the last 24  hours prompted me to proceed with the ablation.  The NovaSure device was set up.   The uterine cavity was 6 cm.  NovaSure device was placed into the uterine cavity and opened.  The width was 4.2 cm.  A seating procedure was performed, followed by a seal test.  Once this was passed, the device was turned on.  The patient tolerated the  procedure well.  The device was removed.  The hysteroscope was placed back in the uterine cavity, and an adequate burn was seen.  The patient was then awoken and taken to recovery room in stable condition.  She was discharged to home.  She was instructed  to take Advil as needed.  She will follow up in the office in 2 weeks.  We will await the pathology.  LN/NUANCE  D:02/05/2018 T:02/06/2018 JOB:[id number]

## 2018-02-07 LAB — HEMOGLOBIN AND HEMATOCRIT, BLOOD
HEMATOCRIT: 21.8 % — AB (ref 36.0–46.0)
HEMOGLOBIN: 7.8 g/dL — AB (ref 12.0–15.0)

## 2018-02-07 NOTE — Progress Notes (Signed)
Patient had post transfusion H&H scheduled at 0700 but lab drew blood earlier around  than scheduled that. Technician wants to be notified if lab draw needs to be done again.

## 2018-02-07 NOTE — Progress Notes (Signed)
Pt was admitted for blood transfusion because she became symptomatic after recent blood loss from anemia. She had undergone a D&C ablation for menorrhagia after her Hgb got down to 6.5. She was stable post op and request not to have blood transfusion. At home she was having dizziness, presyncopy, fatigue. Prior to transfusion the risks of blood was discussed. She now feels much better and wishes to go home. Will discharge

## 2018-02-07 NOTE — Progress Notes (Signed)
Blood transfusion completed at the said time. No reactions were observed, tolerated it well.

## 2018-02-08 LAB — BPAM RBC
Blood Product Expiration Date: 201905102359
Blood Product Expiration Date: 201905222359
ISSUE DATE / TIME: 201905030035
ISSUE DATE / TIME: 201905022125
UNIT TYPE AND RH: 9500
Unit Type and Rh: 5100

## 2018-02-08 LAB — TYPE AND SCREEN
ABO/RH(D): O POS
ANTIBODY SCREEN: NEGATIVE
UNIT DIVISION: 0
Unit division: 0

## 2018-02-11 NOTE — Discharge Summary (Signed)
Pt was admitted for a blood transfusion secondary to symptomatic anemia. She had lost a significant amt of blood from vaginal bleeding secondary to endometrial polyps. She was transfused two units of blood over night. She felt much better in the am. She was ambulating and eating. She was discharged to home and instructed to followup in the office. She had no vaginal bleeding at the time

## 2018-02-27 DIAGNOSIS — Z124 Encounter for screening for malignant neoplasm of cervix: Secondary | ICD-10-CM | POA: Diagnosis not present

## 2018-02-27 DIAGNOSIS — Z01419 Encounter for gynecological examination (general) (routine) without abnormal findings: Secondary | ICD-10-CM | POA: Diagnosis not present

## 2018-02-27 DIAGNOSIS — Z6822 Body mass index (BMI) 22.0-22.9, adult: Secondary | ICD-10-CM | POA: Diagnosis not present

## 2018-03-07 DIAGNOSIS — Z1231 Encounter for screening mammogram for malignant neoplasm of breast: Secondary | ICD-10-CM | POA: Diagnosis not present

## 2018-03-07 DIAGNOSIS — Z6822 Body mass index (BMI) 22.0-22.9, adult: Secondary | ICD-10-CM | POA: Diagnosis not present

## 2018-04-21 DIAGNOSIS — D2260 Melanocytic nevi of unspecified upper limb, including shoulder: Secondary | ICD-10-CM | POA: Diagnosis not present

## 2018-04-21 DIAGNOSIS — D2270 Melanocytic nevi of unspecified lower limb, including hip: Secondary | ICD-10-CM | POA: Diagnosis not present

## 2018-04-21 DIAGNOSIS — Z872 Personal history of diseases of the skin and subcutaneous tissue: Secondary | ICD-10-CM | POA: Diagnosis not present

## 2018-04-21 DIAGNOSIS — D225 Melanocytic nevi of trunk: Secondary | ICD-10-CM | POA: Diagnosis not present

## 2018-04-28 DIAGNOSIS — Z6822 Body mass index (BMI) 22.0-22.9, adult: Secondary | ICD-10-CM | POA: Diagnosis not present

## 2018-04-28 DIAGNOSIS — R350 Frequency of micturition: Secondary | ICD-10-CM | POA: Diagnosis not present

## 2018-05-28 DIAGNOSIS — M25552 Pain in left hip: Secondary | ICD-10-CM | POA: Diagnosis not present

## 2018-05-28 DIAGNOSIS — M9901 Segmental and somatic dysfunction of cervical region: Secondary | ICD-10-CM | POA: Diagnosis not present

## 2018-05-28 DIAGNOSIS — R51 Headache: Secondary | ICD-10-CM | POA: Diagnosis not present

## 2018-06-13 DIAGNOSIS — L237 Allergic contact dermatitis due to plants, except food: Secondary | ICD-10-CM | POA: Diagnosis not present

## 2018-07-05 IMAGING — MR MR KNEE*R* W/O CM
4 of 6 series · 18 of 40 positions shown · non-contrast
Comparison: Radiograph 05/01/2017.

CLINICAL DATA: Knee injury 1 week ago. Patient jammed knee while
moving at high rate of speed. Evaluate for possible tear.

EXAM:
MRI OF THE RIGHT KNEE WITHOUT CONTRAST
TECHNIQUE: Multiplanar, multisequence MR imaging of the knee was performed. No
intravenous contrast was administered.

[Series 2: PD fat-sat · axial · 4.0mm · 0.29mm/px · z∈[-97,+38]mm · 8 of 28 slices shown (1 of 4)]
[im 1/28]
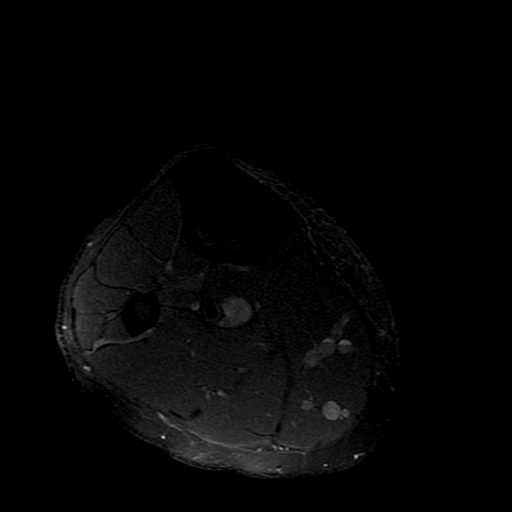
[im 4/28]
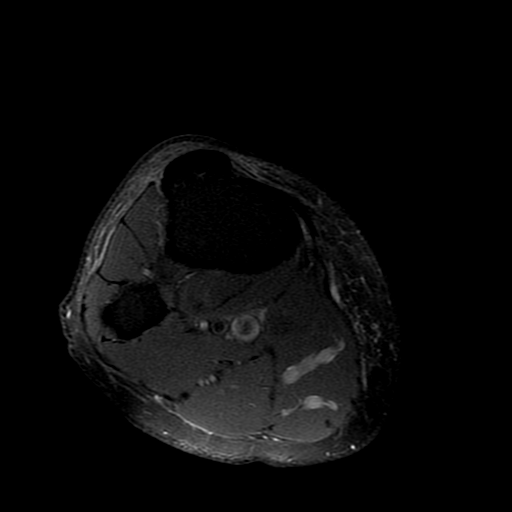
[im 8/28]
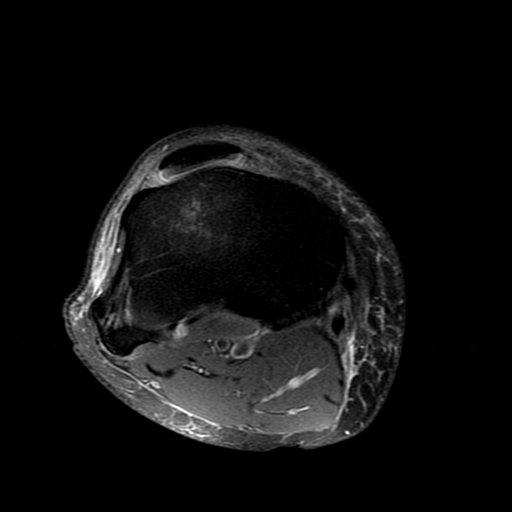
[im 12/28]
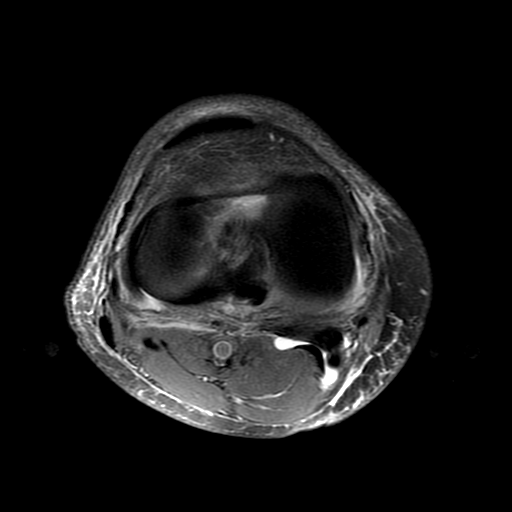
[im 16/28]
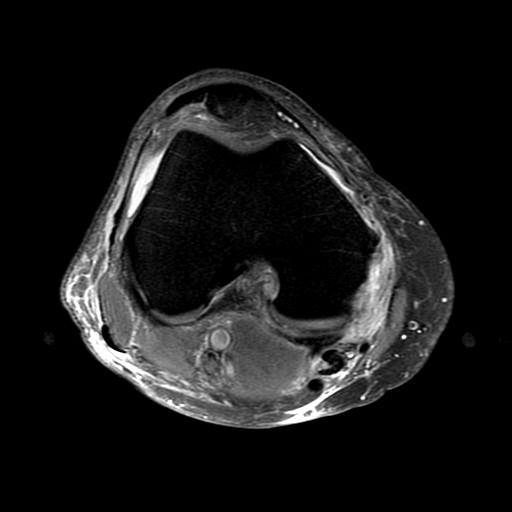
[im 20/28]
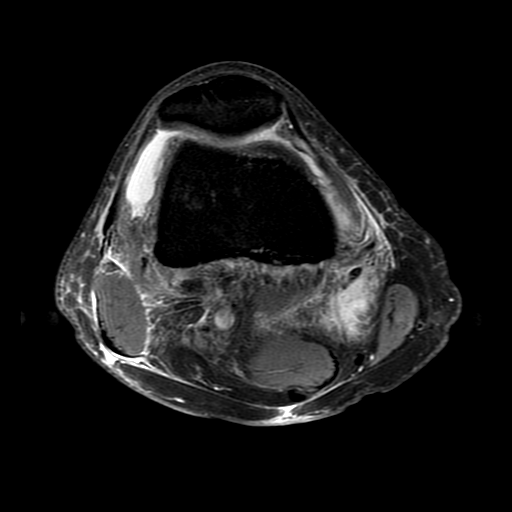
[im 24/28]
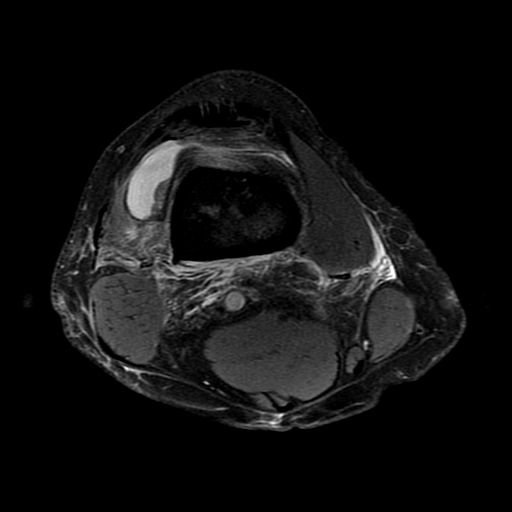
[im 28/28]
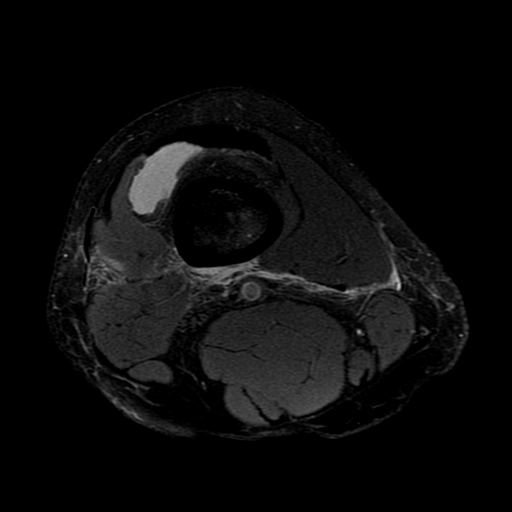

[Series 3: PD fat-sat · coronal · 4.0mm · 0.31mm/px · 4 of 20 slices shown (2 of 4)]
[im 1/20]
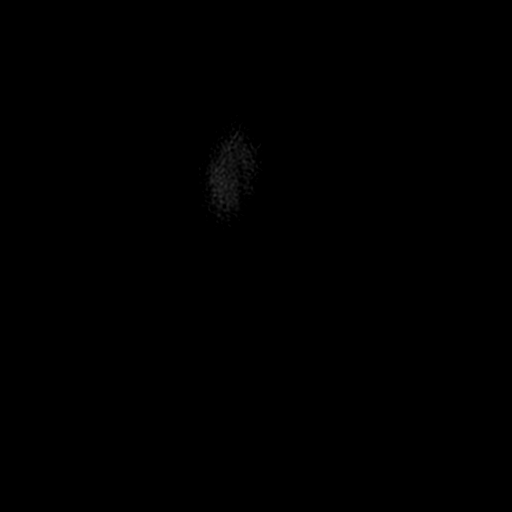
[im 4/20]
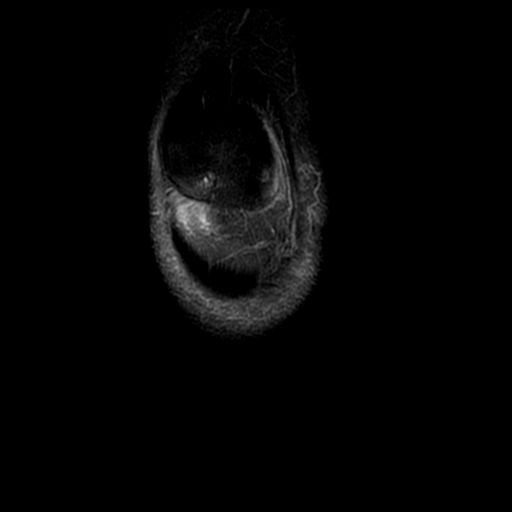
[im 10/20]
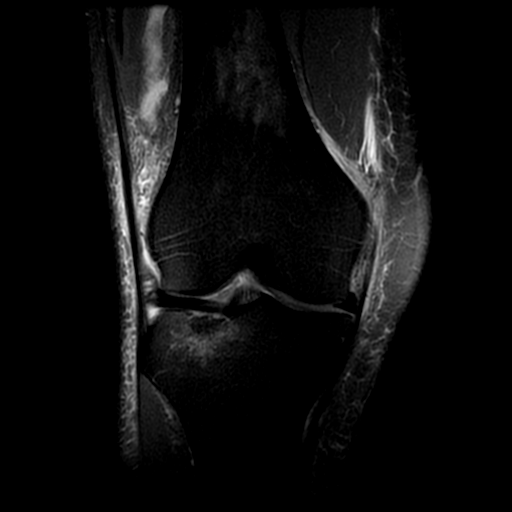
[im 16/20]
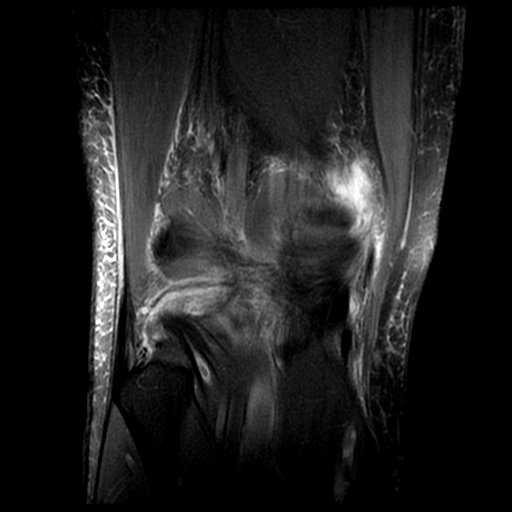

[Series 5: PD fat-sat · sagittal · 4.0mm · 0.31mm/px · 3 of 22 slices shown (3 of 4)]
[im 4/22]
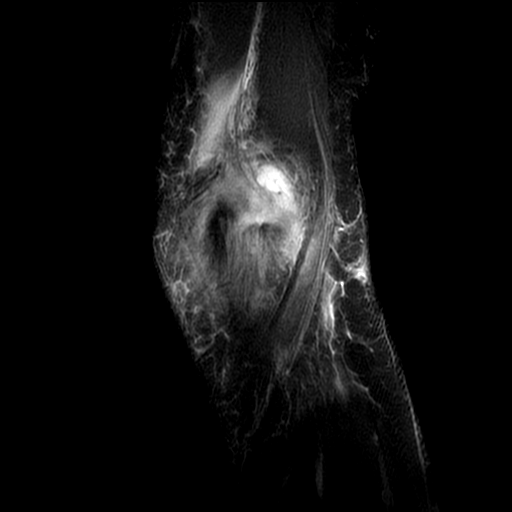
[im 11/22]
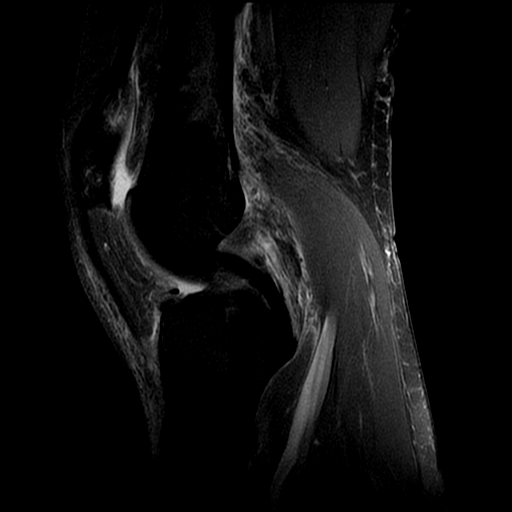
[im 18/22]
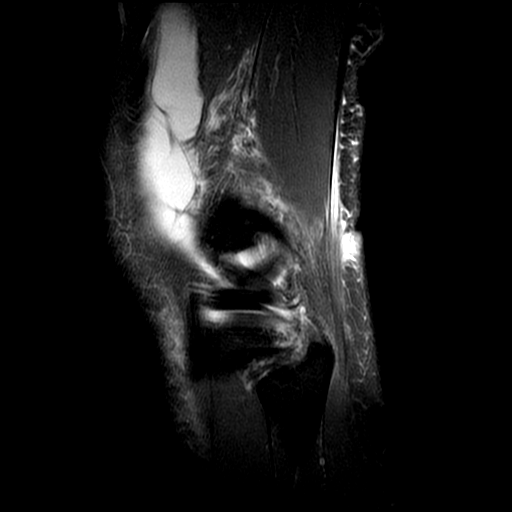

[Series 7: PD fat-sat · coronal · 2.0mm · 0.31mm/px · 3 of 12 slices shown (4 of 4)]
[im 1/12]
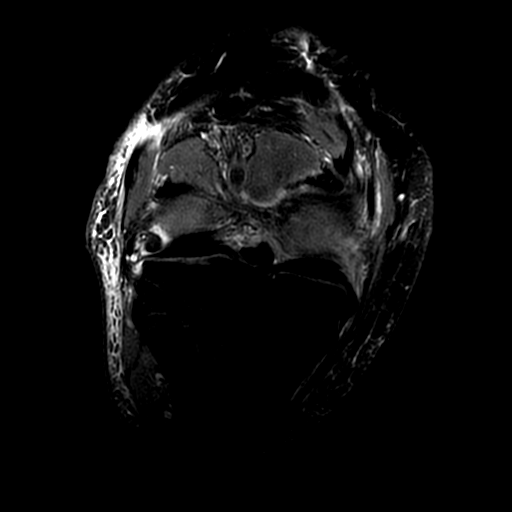
[im 8/12]
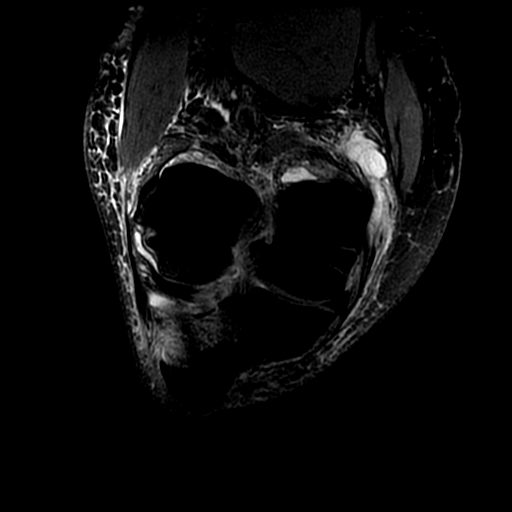
[im 12/12]
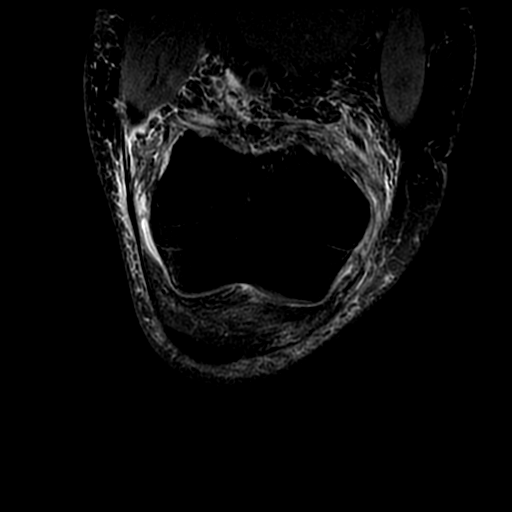

[18 of 40 positions shown; findings below may reference images not displayed]

FINDINGS: MENISCI

Medial meniscus:  Intact with normal morphology.

Lateral meniscus:  Intact with normal morphology.

LIGAMENTS

Cruciates:  Intact.

Collaterals: There is partial tearing of the posterior fibers of the
medial collateral ligament. There is associated surrounding soft
tissue edema. The anterior fibers are intact. The components of the
lateral collateral ligament complex appear normal.

CARTILAGE

Patellofemoral: Underlying patellofemoral degenerative changes with
chondral thinning and subchondral cyst formation inferiorly in the
lateral facet. The trochlear cartilage is intact.

Medial:  Preserved.

Lateral:  Preserved.

MISCELLANEOUS

Joint: Moderate size joint effusion. No layering intra-articular fat
or loose body demonstrated.

Popliteal Fossa: Small Baker's cyst. There is additional bursal
fluid more peripherally, deep to the sartorius muscle. There is mild
generalized edema throughout the popliteal fossa.

Extensor Mechanism:  Intact.

Bones: There is an acute fracture of the lateral tibial plateau
anteriorly. There is an approximately 10 mm die punch component
which is most obvious on the axial images. This demonstrates
approximately 2 mm of depression on the coronal images. There is
surrounding marrow edema. There is a mild bone contusion of the
lateral femoral condyle anteriorly.

Other: As above, there is subcutaneous edema surrounding the knee.
There is a small amount of edema superolaterally in Hoffa's fat.
IMPRESSION: 1. Acute fracture of the lateral tibial plateau with small die punch
component anteriorly, minimally depressed.
2. Mild bone contusion of the lateral femoral condyle anteriorly.
3. Moderate-sized joint effusion.  No lipohemarthrosis.
4. Grade 2 injury of the medial collateral ligament posteriorly.
5. Patellofemoral degenerative changes.
6. Intact menisci and cruciate ligaments.

## 2018-08-18 DIAGNOSIS — F4322 Adjustment disorder with anxiety: Secondary | ICD-10-CM | POA: Diagnosis not present

## 2018-11-06 DIAGNOSIS — F909 Attention-deficit hyperactivity disorder, unspecified type: Secondary | ICD-10-CM | POA: Diagnosis not present

## 2018-11-06 DIAGNOSIS — M79602 Pain in left arm: Secondary | ICD-10-CM | POA: Diagnosis not present

## 2018-11-06 DIAGNOSIS — G43001 Migraine without aura, not intractable, with status migrainosus: Secondary | ICD-10-CM | POA: Diagnosis not present

## 2018-11-06 DIAGNOSIS — R5383 Other fatigue: Secondary | ICD-10-CM | POA: Diagnosis not present

## 2018-11-06 MED FILL — DICLOFENAC SOD EC 50 MG TAB: 50 | 7 days supply | Qty: 15 | Fill #0

## 2018-11-06 MED FILL — VYVANSE 30 MG CAPSULE: 30 | 30 days supply | Qty: 30 | Fill #0

## 2018-12-05 DIAGNOSIS — I10 Essential (primary) hypertension: Secondary | ICD-10-CM | POA: Diagnosis not present

## 2018-12-05 DIAGNOSIS — F909 Attention-deficit hyperactivity disorder, unspecified type: Secondary | ICD-10-CM | POA: Diagnosis not present

## 2019-02-20 MED FILL — VYVANSE 30 MG CAPSULE: 30 | 30 days supply | Qty: 30 | Fill #0

## 2019-04-01 DIAGNOSIS — Z124 Encounter for screening for malignant neoplasm of cervix: Secondary | ICD-10-CM | POA: Diagnosis not present

## 2019-04-01 DIAGNOSIS — Z6821 Body mass index (BMI) 21.0-21.9, adult: Secondary | ICD-10-CM | POA: Diagnosis not present

## 2019-04-01 DIAGNOSIS — Z1231 Encounter for screening mammogram for malignant neoplasm of breast: Secondary | ICD-10-CM | POA: Diagnosis not present

## 2019-04-01 DIAGNOSIS — Z01419 Encounter for gynecological examination (general) (routine) without abnormal findings: Secondary | ICD-10-CM | POA: Diagnosis not present

## 2019-05-28 DIAGNOSIS — F909 Attention-deficit hyperactivity disorder, unspecified type: Secondary | ICD-10-CM | POA: Diagnosis not present

## 2019-05-28 DIAGNOSIS — I10 Essential (primary) hypertension: Secondary | ICD-10-CM | POA: Diagnosis not present

## 2019-05-28 DIAGNOSIS — Z8249 Family history of ischemic heart disease and other diseases of the circulatory system: Secondary | ICD-10-CM | POA: Diagnosis not present

## 2019-07-01 NOTE — Progress Notes (Signed)
Virtual Visit via Video Note   This visit type was conducted due to national recommendations for restrictions regarding the COVID-19 Pandemic (e.g. social distancing) in an effort to limit this patient's exposure and mitigate transmission in our community.  Due to her co-morbid illnesses, this patient is at least at moderate risk for complications without adequate follow up.  This format is felt to be most appropriate for this patient at this time.  All issues noted in this document were discussed and addressed.  A limited physical exam was performed with this format.  Please refer to the patient's chart for her consent to telehealth for [facility].   Date:  07/03/2019   ID:  Karen Dominguez, DOB 1971-07-08, MRN [id number]  Patient Location:Home Provider Location: Home  PCP:  Orpah Melter, MD  Cardiologist:  Dr Stanford Breed  Evaluation Performed:  Consultation - Karen Dominguez was referred by Christella Noa MD for the evaluation of hypertension and possible CAD.  Chief Complaint:  Hypertension and possible CAD  History of Present Illness:    48 year old female for evaluation of hypertension and possible coronary artery disease at request of Christella Noa, MD. Patient states she has had some coronary vasospasm in the past.  However she exercises routinely with no significant dyspnea, chest pain, palpitations or syncope.  She has a family history of coronary disease and her blood pressure has been elevated.  Cardiology now asked to evaluate.  The patient does not have symptoms concerning for COVID-19 infection (fever, chills, cough, or new shortness of breath).    Past Medical History:  Diagnosis Date  . Hypertension   . Migraine    Past Surgical History:  Procedure Laterality Date  . ABDOMINOPLASTY    . APPENDECTOMY    . CESAREAN SECTION    . DILITATION & CURRETTAGE/HYSTROSCOPY WITH NOVASURE ABLATION N/A 02/05/2018   Procedure: DILATATION & CURETTAGE/HYSTEROSCOPY WITH NOVASURE  ABLATION;  Surgeon: Olga Millers, MD;  Location: [facility];  Service: Gynecology;  Laterality: N/A;  . TONSILLECTOMY       Current Meds  Medication Sig  . hydrochlorothiazide (MICROZIDE) 12.5 MG capsule Take 12.5 mg by mouth daily.  . Omega-3 Fatty Acids (FISH OIL PO) Take 2 capsules by mouth daily.  Marland Kitchen VYVANSE 30 MG capsule      Allergies:   Erythromycin   Social History   Tobacco Use  . Smoking status: Former Smoker    Years: 6.00  . Smokeless tobacco: Never Used  Substance Use Topics  . Alcohol use: Yes    Comment: occasional  . Drug use: No     Family Hx: The patient's family history includes CAD in her brother and father; Stroke in her father.  ROS:   Please see the history of present illness.    No Fever, chills  or productive cough All other systems reviewed and are negative.   Wt Readings from Last 3 Encounters:  07/03/19 118 lb (53.5 kg)  06/27/17 118 lb (53.5 kg)  05/31/17 118 lb (53.5 kg)     Objective:    Vital Signs:  BP (!) 168/82 (BP Location: Left Arm, Patient Position: Sitting, Cuff Size: Normal)   Pulse 67   Ht 5\' 2"  (0000000 m)   Wt 118 lb (53.5 kg)   SpO2 99%   BMI 21.58 kg/m    VITAL SIGNS:  reviewed NAD Answers questions appropriately Normal affect Remainder of physical examination not performed (telehealth visit; coronavirus pandemic)  ASSESSMENT & PLAN:  1. Multiple risk factors for coronary disease including hypertension, family history and prior tobacco use-patient is concerned about possible coronary artery disease.  We will arrange a calcium score for prognostic information.  If elevated will consider aspirin and statin.  I will check baseline lipids and if elevated we will consider additional therapy. 2. Hypertension-patient's blood pressure has been elevated.  She takes hydrochlorothiazide 12.5 milligrams 3 times weekly.  We will change this to daily.  Check potassium and renal function in 1 week.  If blood pressure remains high  we will add additional medications.   COVID-19 Education: The importance of social distancing was discussed today.  Time:   Today, I have spent 20 minutes with the patient with telehealth technology discussing the above problems.     Medication Adjustments/Labs and Tests Ordered: Current medicines are reviewed at length with the patient today.  Concerns regarding medicines are outlined above.   Tests Ordered: No orders of the defined types were placed in this encounter.   Medication Changes: No orders of the defined types were placed in this encounter.   Follow Up:  Virtual Visit or In Person in 6 month(s)  Signed, Kirk Ruths, MD  07/03/2019 9:00 AM    [place]

## 2019-07-03 ENCOUNTER — Telehealth (INDEPENDENT_AMBULATORY_CARE_PROVIDER_SITE_OTHER): Payer: BC Managed Care – PPO | Admitting: Cardiology

## 2019-07-03 ENCOUNTER — Encounter: Payer: Self-pay | Admitting: Cardiology

## 2019-07-03 ENCOUNTER — Other Ambulatory Visit: Payer: Self-pay

## 2019-07-03 VITALS — BP 168/82 | HR 67 | Ht 62.0 in | Wt 118.0 lb

## 2019-07-03 DIAGNOSIS — I2510 Atherosclerotic heart disease of native coronary artery without angina pectoris: Secondary | ICD-10-CM | POA: Diagnosis not present

## 2019-07-03 DIAGNOSIS — I10 Essential (primary) hypertension: Secondary | ICD-10-CM

## 2019-07-03 NOTE — Patient Instructions (Signed)
Medication Instructions:  TAKE HCTZ 12.5 MG ONCE DAILY  If you need a refill on your cardiac medications before your next appointment, please call your pharmacy.   Lab work: Your physician recommends that you return for lab work in:[place]  If you have labs (blood work) drawn today and your tests are completely normal, you will receive your results only by: Marland Kitchen MyChart Message (if you have MyChart) OR . A paper copy in the mail If you have any lab test that is abnormal or we need to change your treatment, we will call you to review the results.  Testing/Procedures: CORONARY CALCIUM SCORE AT [place]  Follow-Up: At [facility], you and your health needs are our priority.  As part of our continuing mission to provide you with exceptional heart care, we have created designated Provider Care Teams.  These Care Teams include your primary Cardiologist (physician) and Advanced Practice Providers (APPs -  Physician Assistants and Nurse Practitioners) who all work together to provide you with the care you need, when you need it. You will need a follow up appointment in 6 months.  Please call our office 2 months in advance to schedule this appointment.  You may see Kirk Ruths MD or one of the following Advanced Practice Providers on your designated Care Team:   Kerin Ransom, PA-C Roby Lofts, [place] . Sande Rives, PA-C

## 2019-07-08 DIAGNOSIS — I10 Essential (primary) hypertension: Secondary | ICD-10-CM | POA: Diagnosis not present

## 2019-07-31 DIAGNOSIS — Z1159 Encounter for screening for other viral diseases: Secondary | ICD-10-CM | POA: Diagnosis not present

## 2019-08-04 DIAGNOSIS — Z86018 Personal history of other benign neoplasm: Secondary | ICD-10-CM | POA: Diagnosis not present

## 2019-08-04 DIAGNOSIS — B078 Other viral warts: Secondary | ICD-10-CM | POA: Diagnosis not present

## 2019-08-04 DIAGNOSIS — L814 Other melanin hyperpigmentation: Secondary | ICD-10-CM | POA: Diagnosis not present

## 2019-08-04 DIAGNOSIS — Z23 Encounter for immunization: Secondary | ICD-10-CM | POA: Diagnosis not present

## 2019-08-05 DIAGNOSIS — D123 Benign neoplasm of transverse colon: Secondary | ICD-10-CM | POA: Diagnosis not present

## 2019-08-05 DIAGNOSIS — Z800 Family history of malignant neoplasm of digestive organs: Secondary | ICD-10-CM | POA: Diagnosis not present

## 2019-08-05 DIAGNOSIS — Z8371 Family history of colonic polyps: Secondary | ICD-10-CM | POA: Diagnosis not present

## 2019-08-05 DIAGNOSIS — Z1211 Encounter for screening for malignant neoplasm of colon: Secondary | ICD-10-CM | POA: Diagnosis not present

## 2019-08-05 DIAGNOSIS — K6289 Other specified diseases of anus and rectum: Secondary | ICD-10-CM | POA: Diagnosis not present

## 2019-08-13 ENCOUNTER — Other Ambulatory Visit: Payer: Self-pay

## 2019-08-13 ENCOUNTER — Ambulatory Visit (INDEPENDENT_AMBULATORY_CARE_PROVIDER_SITE_OTHER)
Admission: RE | Admit: 2019-08-13 | Discharge: 2019-08-13 | Disposition: A | Discharge status: Home / Self Care | Payer: Self-pay | Source: Ambulatory Visit | Attending: Cardiology | Admitting: Cardiology

## 2019-08-13 DIAGNOSIS — I2510 Atherosclerotic heart disease of native coronary artery without angina pectoris: Secondary | ICD-10-CM

## 2019-08-14 ENCOUNTER — Telehealth: Payer: Self-pay | Admitting: *Deleted

## 2019-08-14 DIAGNOSIS — I712 Thoracic aortic aneurysm, without rupture, unspecified: Secondary | ICD-10-CM

## 2019-08-14 NOTE — Telephone Encounter (Addendum)
Spoke with pt, aware of results. Order placed for CTA at GI.   ----- Message from Lelon Perla, MD sent at 08/14/2019 10:19 AM EST ----- CA score 0; aorta mildly dilated; schedule CTA of thoracic aorta as outlined previously Kirk Ruths

## 2019-08-17 ENCOUNTER — Telehealth: Payer: Self-pay | Admitting: Cardiology

## 2019-08-17 DIAGNOSIS — I712 Thoracic aortic aneurysm, without rupture, unspecified: Secondary | ICD-10-CM

## 2019-08-17 NOTE — Telephone Encounter (Signed)
Per dr Stanford Breed, order placed for MRA to evaluate instead of CTA.

## 2019-08-17 NOTE — Telephone Encounter (Signed)
I just spoke with Karen Dominguez to chedule her CTA chest/aorta--she would like to know if there is something else we can do that does not require so much radiation

## 2019-09-09 ENCOUNTER — Other Ambulatory Visit: Payer: Self-pay

## 2019-09-09 ENCOUNTER — Ambulatory Visit
Admission: RE | Admit: 2019-09-09 | Discharge: 2019-09-09 | Disposition: A | Discharge status: Home / Self Care | Payer: BC Managed Care – PPO | Source: Ambulatory Visit | Attending: Cardiology | Admitting: Cardiology

## 2019-09-09 DIAGNOSIS — I712 Thoracic aortic aneurysm, without rupture, unspecified: Secondary | ICD-10-CM

## 2019-09-09 DIAGNOSIS — I77810 Thoracic aortic ectasia: Secondary | ICD-10-CM | POA: Diagnosis not present

## 2019-09-09 MED ORDER — GADOBENATE DIMEGLUMINE 529 MG/ML IV SOLN
10.0000 mL | Freq: Once | INTRAVENOUS | Status: AC | PRN
  Administered 2019-09-09: 10 mL via INTRAVENOUS

## 2019-09-14 ENCOUNTER — Encounter: Payer: Self-pay | Admitting: Cardiology

## 2019-10-19 NOTE — Progress Notes (Signed)
HPI: FU hypertension and TAA.  Calcium score November 2020 0.  Ascending aorta mildly dilated at 40 mm.  MRA December 2020 showed 3.8 cm thoracic aorta.  Since last seen patient denies dyspnea, chest pain, palpitations or syncope.  Her blood pressure is running high at home.  Current Outpatient Medications  Medication Sig Dispense Refill  . BIOTIN PO Take 1 tablet by mouth daily.    . COLLAGEN PO Take 1 tablet by mouth daily.    . hydrochlorothiazide (MICROZIDE) 12.5 MG capsule Take 12.5 mg by mouth daily.    . Omega-3 Fatty Acids (FISH OIL PO) Take 2 capsules by mouth daily.    Marland Kitchen VITAMIN D, CHOLECALCIFEROL, PO Take 1 tablet by mouth daily.     No current facility-administered medications for this visit.     Past Medical History:  Diagnosis Date  . Hypertension   . Migraine     Past Surgical History:  Procedure Laterality Date  . ABDOMINOPLASTY    . APPENDECTOMY    . CESAREAN SECTION    . DILITATION & CURRETTAGE/HYSTROSCOPY WITH NOVASURE ABLATION N/A 02/05/2018   Procedure: DILATATION & CURETTAGE/HYSTEROSCOPY WITH NOVASURE ABLATION;  Surgeon: Olga Millers, MD;  Location: [facility];  Service: Gynecology;  Laterality: N/A;  . TONSILLECTOMY      Social History   Socioeconomic History  . Marital status: Married    Spouse name: Not on file  . Number of children: 3  . Years of education: Not on file  . Highest education level: Not on file  Occupational History  . Not on file  Tobacco Use  . Smoking status: Former Smoker    Years: 6.00  . Smokeless tobacco: Never Used  Substance and Sexual Activity  . Alcohol use: Yes    Comment: occasional  . Drug use: No  . Sexual activity: Yes  Other Topics Concern  . Not on file  Social History Narrative  . Not on file   Social Determinants of Health   Financial Resource Strain:   . Difficulty of Paying Living Expenses: Not on file  Food Insecurity:   . Worried About Charity fundraiser in the Last Year: Not on file   . Ran Out of Food in the Last Year: Not on file  Transportation Needs:   . Lack of Transportation (Medical): Not on file  . Lack of Transportation (Non-Medical): Not on file  Physical Activity:   . Days of Exercise per Week: Not on file  . Minutes of Exercise per Session: Not on file  Stress:   . Feeling of Stress : Not on file  Social Connections:   . Frequency of Communication with Friends and Family: Not on file  . Frequency of Social Gatherings with Friends and Family: Not on file  . Attends Religious Services: Not on file  . Active Member of Clubs or Organizations: Not on file  . Attends Archivist Meetings: Not on file  . Marital Status: Not on file  Intimate Partner Violence:   . Fear of Current or Ex-Partner: Not on file  . Emotionally Abused: Not on file  . Physically Abused: Not on file  . Sexually Abused: Not on file    Family History  Problem Relation Age of Onset  . CAD Father   . Stroke Father   . CAD Brother     ROS: no fevers or chills, productive cough, hemoptysis, dysphasia, odynophagia, melena, hematochezia, dysuria, hematuria, rash, seizure activity, orthopnea,  PND, pedal edema, claudication. Remaining systems are negative.  Physical Exam: Well-developed well-nourished in no acute distress.  Skin is warm and dry.  HEENT is normal.  Neck is supple.  Chest is clear to auscultation with normal expansion.  Cardiovascular exam is regular rate and rhythm.  Abdominal exam nontender or distended. No masses palpated. Extremities show no edema. neuro grossly intact  ECG-ectopic bradycardia with no ST changes.  Personally reviewed  A/P  1 hypertension-blood pressure elevated.  Add losartan 50 mg daily.  Check potassium and renal function in 1 week.  Adjust regimen based on follow-up readings.  2 minimally dilated aortic root-we will plan to repeat MRA December 2021.  3 history of snoring-we will arrange sleep study to exclude sleep apnea.   Kirk Ruths, MD

## 2019-10-22 ENCOUNTER — Encounter: Payer: Self-pay | Admitting: Cardiology

## 2019-10-22 ENCOUNTER — Other Ambulatory Visit: Payer: Self-pay

## 2019-10-22 ENCOUNTER — Ambulatory Visit: Payer: BC Managed Care – PPO | Admitting: Cardiology

## 2019-10-22 VITALS — BP 160/100 | HR 50 | Temp 97.3°F | Ht 62.0 in | Wt 122.0 lb

## 2019-10-22 DIAGNOSIS — R0683 Snoring: Secondary | ICD-10-CM

## 2019-10-22 DIAGNOSIS — I712 Thoracic aortic aneurysm, without rupture, unspecified: Secondary | ICD-10-CM

## 2019-10-22 DIAGNOSIS — I10 Essential (primary) hypertension: Secondary | ICD-10-CM

## 2019-10-22 MED ORDER — LOSARTAN POTASSIUM 50 MG PO TABS: 50.0000 mg | ORAL_TABLET | Freq: Every day | ORAL | 3 refills | Status: DC

## 2019-10-22 NOTE — Patient Instructions (Signed)
Medication Instructions:  START LOSARTAN 50 MG ONCE DAILY  *If you need a refill on your cardiac medications before your next appointment, please call your pharmacy*  Lab Work: Your physician recommends that you return for lab work in: [place]   If you have labs (blood work) drawn today and your tests are completely normal, you will receive your results only by: Marland Kitchen MyChart Message (if you have MyChart) OR . A paper copy in the mail If you have any lab test that is abnormal or we need to change your treatment, we will call you to review the results.  Testing/Procedures: Your physician has recommended that you have a sleep study. This test records several body functions during sleep, including: brain activity, eye movement, oxygen and carbon dioxide blood levels, heart rate and rhythm, breathing rate and rhythm, the flow of air through your mouth and nose, snoring, body muscle movements, and chest and belly movement.[place]    Follow-Up: At [facility], you and your health needs are our priority.  As part of our continuing mission to provide you with exceptional heart care, we have created designated Provider Care Teams.  These Care Teams include your primary Cardiologist (physician) and Advanced Practice Providers (APPs -  Physician Assistants and Nurse Practitioners) who all work together to provide you with the care you need, when you need it.  Your next appointment:   6 month(s)  The format for your next appointment:   Either In Person or Virtual  Provider:   You may see Kirk Ruths MD or one of the following Advanced Practice Providers on your designated Care Team:    Kerin Ransom, PA-C  [place], [place]  Coletta Memos, [place]

## 2019-10-23 ENCOUNTER — Other Ambulatory Visit: Payer: Self-pay | Admitting: Cardiology

## 2019-10-23 DIAGNOSIS — R0683 Snoring: Secondary | ICD-10-CM

## 2019-11-09 ENCOUNTER — Encounter: Payer: Self-pay | Admitting: *Deleted

## 2019-11-19 DIAGNOSIS — I2510 Atherosclerotic heart disease of native coronary artery without angina pectoris: Secondary | ICD-10-CM | POA: Diagnosis not present

## 2019-11-20 LAB — BASIC METABOLIC PANEL
BUN/Creatinine Ratio: 21 (ref 9–23)
BUN: 18 mg/dL (ref 6–24)
CO2: 26 mmol/L (ref 20–29)
Calcium: 9.9 mg/dL (ref 8.7–10.2)
Chloride: 97 mmol/L (ref 96–106)
Creatinine, Ser: 0.84 mg/dL (ref 0.57–1.00)
GFR calc Af Amer: 94 mL/min/{1.73_m2} (ref >59)
GFR calc non Af Amer: 81 mL/min/{1.73_m2} (ref >59)
Glucose: 85 mg/dL (ref 65–99)
Potassium: 4.1 mmol/L (ref 3.5–5.2)
Sodium: 142 mmol/L (ref 134–144)

## 2019-11-20 LAB — LIPID PANEL
Chol/HDL Ratio: 2.8 ratio (ref 0.0–4.4)
Cholesterol, Total: 191 mg/dL (ref 100–199)
HDL: 68 mg/dL (ref >39)
LDL Chol Calc (NIH): 112 mg/dL — ABNORMAL HIGH (ref 0–99)
Triglycerides: 59 mg/dL (ref 0–149)
VLDL Cholesterol Cal: 11 mg/dL (ref 5–40)

## 2019-11-24 ENCOUNTER — Encounter: Payer: Self-pay | Admitting: *Deleted

## 2019-12-24 ENCOUNTER — Encounter (HOSPITAL_BASED_OUTPATIENT_CLINIC_OR_DEPARTMENT_OTHER): Payer: BC Managed Care – PPO | Admitting: Cardiovascular Disease

## 2020-02-15 DIAGNOSIS — Z20828 Contact with and (suspected) exposure to other viral communicable diseases: Secondary | ICD-10-CM | POA: Diagnosis not present

## 2020-02-15 DIAGNOSIS — Z03818 Encounter for observation for suspected exposure to other biological agents ruled out: Secondary | ICD-10-CM | POA: Diagnosis not present

## 2020-02-18 DIAGNOSIS — Z03818 Encounter for observation for suspected exposure to other biological agents ruled out: Secondary | ICD-10-CM | POA: Diagnosis not present

## 2020-02-18 DIAGNOSIS — Z20828 Contact with and (suspected) exposure to other viral communicable diseases: Secondary | ICD-10-CM | POA: Diagnosis not present

## 2020-04-28 DIAGNOSIS — Z6822 Body mass index (BMI) 22.0-22.9, adult: Secondary | ICD-10-CM | POA: Diagnosis not present

## 2020-04-28 DIAGNOSIS — Z1231 Encounter for screening mammogram for malignant neoplasm of breast: Secondary | ICD-10-CM | POA: Diagnosis not present

## 2020-04-28 DIAGNOSIS — Z124 Encounter for screening for malignant neoplasm of cervix: Secondary | ICD-10-CM | POA: Diagnosis not present

## 2020-04-28 DIAGNOSIS — Z01419 Encounter for gynecological examination (general) (routine) without abnormal findings: Secondary | ICD-10-CM | POA: Diagnosis not present

## 2020-05-09 ENCOUNTER — Telehealth: Payer: Self-pay | Admitting: Cardiology

## 2020-05-09 NOTE — Telephone Encounter (Signed)
LVM for patient to return call to get follow up scheduled with Crenshaw from recall list 

## 2020-05-19 ENCOUNTER — Other Ambulatory Visit: Payer: Self-pay | Admitting: Cardiology

## 2020-05-19 MED ORDER — HYDROCHLOROTHIAZIDE 12.5 MG PO CAPS: 12.5000 mg | ORAL_CAPSULE | Freq: Every day | ORAL | 3 refills | Status: DC

## 2020-05-19 NOTE — Telephone Encounter (Signed)
   *  STAT* If patient is at the pharmacy, call can be transferred to refill team.   1. Which medications need to be refilled? (please list name of each medication and dose if known)   hydrochlorothiazide (MICROZIDE) 12.5 MG capsule    2. Which pharmacy/location (including street and city if local pharmacy) is medication to be sent to? [id number] [place],  - [address]  3. Do they need a 30 day or 90 day supply? 90 days  Pt said she mentioned this prescription to Dr. Stanford Breed and he agreed he can refill it for her

## 2020-07-25 ENCOUNTER — Telehealth (HOSPITAL_COMMUNITY): Payer: Self-pay | Admitting: Professional

## 2020-08-03 NOTE — Progress Notes (Signed)
HPI:FU hypertension and TAA.  Calcium score November 2020 0.  Ascending aorta mildly dilated at 40 mm.  MRA December 2020 showed 3.8 cm thoracic aorta.  Since last seen she denies dyspnea, chest pain, palpitations or syncope.  Current Outpatient Medications  Medication Sig Dispense Refill   BIOTIN PO Take 1 tablet by mouth daily.     hydrochlorothiazide (MICROZIDE) 12.5 MG capsule Take 12.5 mg by mouth daily.     hydrochlorothiazide (MICROZIDE) 12.5 MG capsule Take 1 capsule (12.5 mg total) by mouth daily. 90 capsule 3   Omega-3 Fatty Acids (FISH OIL PO) Take 2 capsules by mouth daily.     VITAMIN D, CHOLECALCIFEROL, PO Take 1 tablet by mouth daily.     losartan (COZAAR) 50 MG tablet Take 1 tablet (50 mg total) by mouth daily. 90 tablet 3   No current facility-administered medications for this visit.     Past Medical History:  Diagnosis Date   Hypertension    Migraine     Past Surgical History:  Procedure Laterality Date   ABDOMINOPLASTY     APPENDECTOMY     CESAREAN SECTION     DILITATION & CURRETTAGE/HYSTROSCOPY WITH NOVASURE ABLATION N/A 02/05/2018   Procedure: DILATATION & CURETTAGE/HYSTEROSCOPY WITH NOVASURE ABLATION;  Surgeon: Olga Millers, MD;  Location: [facility];  Service: Gynecology;  Laterality: N/A;   TONSILLECTOMY      Social History   Socioeconomic History   Marital status: Married    Spouse name: Not on file   Number of children: 3   Years of education: Not on file   Highest education level: Not on file  Occupational History   Not on file  Tobacco Use   Smoking status: Former Smoker    Years: 6.00   Smokeless tobacco: Never Used  Substance and Sexual Activity   Alcohol use: Yes    Comment: occasional   Drug use: No   Sexual activity: Yes  Other Topics Concern   Not on file  Social History Narrative   Not on file   Social Determinants of Health   Financial Resource Strain:    Difficulty of Paying Living  Expenses: Not on file  Food Insecurity:    Worried About Charity fundraiser in the Last Year: Not on file   [employer] of Food in the Last Year: Not on file  Transportation Needs:    Lack of Transportation (Medical): Not on file   Lack of Transportation (Non-Medical): Not on file  Physical Activity:    Days of Exercise per Week: Not on file   Minutes of Exercise per Session: Not on file  Stress:    Feeling of Stress : Not on file  Social Connections:    Frequency of Communication with Friends and Family: Not on file   Frequency of Social Gatherings with Friends and Family: Not on file   Attends Religious Services: Not on file   Active Member of Clubs or Organizations: Not on file   Attends Archivist Meetings: Not on file   Marital Status: Not on file  Intimate Partner Violence:    Fear of Current or Ex-Partner: Not on file   Emotionally Abused: Not on file   Physically Abused: Not on file   Sexually Abused: Not on file    Family History  Problem Relation Age of Onset   CAD Father    Stroke Father    CAD Brother     ROS: no  fevers or chills, productive cough, hemoptysis, dysphasia, odynophagia, melena, hematochezia, dysuria, hematuria, rash, seizure activity, orthopnea, PND, pedal edema, claudication. Remaining systems are negative.  Physical Exam: Well-developed well-nourished in no acute distress.  Skin is warm and dry.  HEENT is normal.  Neck is supple.  Chest is clear to auscultation with normal expansion.  Cardiovascular exam is regular rate and rhythm.  Abdominal exam nontender or distended. No masses palpated. Extremities show no edema. neuro grossly intact   A/P  1 dilated aortic root-plan repeat MRA December 2021.  2 hypertension-patient's blood pressure is controlled.  Continue present medical regimen and follow.  Check potassium and renal function.  3 history of snoring-we previously arranged a sleep study. She declines study  at present.  Kirk Ruths, MD

## 2020-08-09 ENCOUNTER — Encounter: Payer: Self-pay | Admitting: Cardiology

## 2020-08-09 ENCOUNTER — Ambulatory Visit: Payer: BC Managed Care – PPO | Admitting: Cardiology

## 2020-08-09 ENCOUNTER — Other Ambulatory Visit: Payer: Self-pay

## 2020-08-09 VITALS — BP 119/86 | HR 73 | Temp 97.3°F | Ht 62.0 in | Wt 131.2 lb

## 2020-08-09 DIAGNOSIS — I712 Thoracic aortic aneurysm, without rupture, unspecified: Secondary | ICD-10-CM

## 2020-08-09 DIAGNOSIS — I10 Essential (primary) hypertension: Secondary | ICD-10-CM | POA: Diagnosis not present

## 2020-08-09 NOTE — Patient Instructions (Signed)
  Lab Work:  Your physician recommends that you HAVE LAB WORK TODAY  If you have labs (blood work) drawn today and your tests are completely normal, you will receive your results only by: Marland Kitchen MyChart Message (if you have MyChart) OR . A paper copy in the mail If you have any lab test that is abnormal or we need to change your treatment, we will call you to review the results.   Testing/Procedures:  MRA OF THE CHEST W/WO TO FOLLOW UP THORACIC ANEURYSM @ [facility] [place] [address]-SCHEDULE IN DECEMBER   Follow-Up: At [facility], you and your health needs are our priority.  As part of our continuing mission to provide you with exceptional heart care, we have created designated Provider Care Teams.  These Care Teams include your primary Cardiologist (physician) and Advanced Practice Providers (APPs -  Physician Assistants and Nurse Practitioners) who all work together to provide you with the care you need, when you need it.  We recommend signing up for the patient portal called "MyChart".  Sign up information is provided on this After Visit Summary.  MyChart is used to connect with patients for Virtual Visits (Telemedicine).  Patients are able to view lab/test results, encounter notes, upcoming appointments, etc.  Non-urgent messages can be sent to your provider as well.   To learn more about what you can do with MyChart, go to [email or web address].    Your next appointment:   12 month(s)  The format for your next appointment:   In Person  Provider:   Kirk Ruths, MD

## 2020-08-31 DIAGNOSIS — I10 Essential (primary) hypertension: Secondary | ICD-10-CM | POA: Diagnosis not present

## 2020-08-31 LAB — BASIC METABOLIC PANEL WITH GFR
BUN/Creatinine Ratio: 22 (ref 9–23)
BUN: 19 mg/dL (ref 6–24)
CO2: 24 mmol/L (ref 20–29)
Calcium: 9.4 mg/dL (ref 8.7–10.2)
Chloride: 105 mmol/L (ref 96–106)
Creatinine, Ser: 0.86 mg/dL (ref 0.57–1.00)
GFR calc Af Amer: 90 mL/min/1.73
GFR calc non Af Amer: 78 mL/min/1.73
Glucose: 96 mg/dL (ref 65–99)
Potassium: 4.1 mmol/L (ref 3.5–5.2)
Sodium: 139 mmol/L (ref 134–144)

## 2020-09-05 ENCOUNTER — Encounter: Payer: Self-pay | Admitting: *Deleted

## 2020-09-13 ENCOUNTER — Other Ambulatory Visit: Payer: BC Managed Care – PPO

## 2020-09-15 ENCOUNTER — Telehealth: Payer: Self-pay | Admitting: Cardiology

## 2020-09-15 NOTE — Telephone Encounter (Signed)
Spoke with patient regarding appointment for MRA chest ordered by Dr. Felix Pacini Tuesday 10/25/20 at 11:00 am at [address]---arrival time is 10:30 am for check in.  I will mail the information to her and she voiced her understanding

## 2020-10-15 DIAGNOSIS — S6991XA Unspecified injury of right wrist, hand and finger(s), initial encounter: Secondary | ICD-10-CM | POA: Diagnosis not present

## 2020-10-15 DIAGNOSIS — F10129 Alcohol abuse with intoxication, unspecified: Secondary | ICD-10-CM | POA: Diagnosis not present

## 2020-10-15 DIAGNOSIS — R112 Nausea with vomiting, unspecified: Secondary | ICD-10-CM | POA: Diagnosis not present

## 2020-10-15 DIAGNOSIS — W19XXXA Unspecified fall, initial encounter: Secondary | ICD-10-CM | POA: Diagnosis not present

## 2020-10-15 DIAGNOSIS — E876 Hypokalemia: Secondary | ICD-10-CM | POA: Diagnosis not present

## 2020-10-15 DIAGNOSIS — S63501A Unspecified sprain of right wrist, initial encounter: Secondary | ICD-10-CM | POA: Diagnosis not present

## 2020-10-15 DIAGNOSIS — R4182 Altered mental status, unspecified: Secondary | ICD-10-CM | POA: Diagnosis not present

## 2020-10-15 DIAGNOSIS — R1110 Vomiting, unspecified: Secondary | ICD-10-CM | POA: Diagnosis not present

## 2020-10-15 DIAGNOSIS — R404 Transient alteration of awareness: Secondary | ICD-10-CM | POA: Diagnosis not present

## 2020-10-25 ENCOUNTER — Other Ambulatory Visit: Payer: BC Managed Care – PPO

## 2020-10-26 DIAGNOSIS — S60011A Contusion of right thumb without damage to nail, initial encounter: Secondary | ICD-10-CM | POA: Diagnosis not present

## 2020-10-26 DIAGNOSIS — M25531 Pain in right wrist: Secondary | ICD-10-CM | POA: Diagnosis not present

## 2020-11-08 ENCOUNTER — Other Ambulatory Visit: Payer: Self-pay

## 2020-11-08 ENCOUNTER — Ambulatory Visit
Admission: RE | Admit: 2020-11-08 | Discharge: 2020-11-08 | Disposition: A | Discharge status: Home / Self Care | Payer: BC Managed Care – PPO | Source: Ambulatory Visit | Attending: Cardiology | Admitting: Cardiology

## 2020-11-08 DIAGNOSIS — I712 Thoracic aortic aneurysm, without rupture, unspecified: Secondary | ICD-10-CM

## 2020-11-08 MED ORDER — GADOBENATE DIMEGLUMINE 529 MG/ML IV SOLN
11.0000 mL | Freq: Once | INTRAVENOUS | Status: AC | PRN
Start: 2020-11-08 — End: 2020-11-08
  Administered 2020-11-08: 11 mL via INTRAVENOUS

## 2020-12-06 DIAGNOSIS — S5002XA Contusion of left elbow, initial encounter: Secondary | ICD-10-CM | POA: Diagnosis not present

## 2020-12-06 DIAGNOSIS — S7012XA Contusion of left thigh, initial encounter: Secondary | ICD-10-CM | POA: Diagnosis not present

## 2020-12-10 ENCOUNTER — Other Ambulatory Visit: Payer: Self-pay | Admitting: Cardiology

## 2020-12-10 DIAGNOSIS — I10 Essential (primary) hypertension: Secondary | ICD-10-CM

## 2020-12-14 NOTE — Telephone Encounter (Signed)
Error

## 2021-05-23 DIAGNOSIS — L700 Acne vulgaris: Secondary | ICD-10-CM | POA: Diagnosis not present

## 2021-05-23 DIAGNOSIS — L820 Inflamed seborrheic keratosis: Secondary | ICD-10-CM | POA: Diagnosis not present

## 2021-05-23 DIAGNOSIS — R238 Other skin changes: Secondary | ICD-10-CM | POA: Diagnosis not present

## 2021-05-23 DIAGNOSIS — L814 Other melanin hyperpigmentation: Secondary | ICD-10-CM | POA: Diagnosis not present

## 2021-05-23 DIAGNOSIS — Z86018 Personal history of other benign neoplasm: Secondary | ICD-10-CM | POA: Diagnosis not present

## 2021-05-23 DIAGNOSIS — L821 Other seborrheic keratosis: Secondary | ICD-10-CM | POA: Diagnosis not present

## 2021-06-07 DIAGNOSIS — Z01419 Encounter for gynecological examination (general) (routine) without abnormal findings: Secondary | ICD-10-CM | POA: Diagnosis not present

## 2021-07-10 ENCOUNTER — Other Ambulatory Visit: Payer: Self-pay | Admitting: Obstetrics and Gynecology

## 2021-07-10 DIAGNOSIS — Z1231 Encounter for screening mammogram for malignant neoplasm of breast: Secondary | ICD-10-CM

## 2021-07-24 ENCOUNTER — Other Ambulatory Visit: Payer: Self-pay

## 2021-07-24 ENCOUNTER — Emergency Department (HOSPITAL_BASED_OUTPATIENT_CLINIC_OR_DEPARTMENT_OTHER)
Admission: EM | Admit: 2021-07-24 | Discharge: 2021-07-24 | Disposition: A | Discharge status: Home / Self Care | Payer: BC Managed Care – PPO | Attending: Emergency Medicine | Admitting: Emergency Medicine

## 2021-07-24 ENCOUNTER — Encounter (HOSPITAL_BASED_OUTPATIENT_CLINIC_OR_DEPARTMENT_OTHER): Payer: Self-pay

## 2021-07-24 DIAGNOSIS — Z87891 Personal history of nicotine dependence: Secondary | ICD-10-CM | POA: Insufficient documentation

## 2021-07-24 DIAGNOSIS — G43809 Other migraine, not intractable, without status migrainosus: Secondary | ICD-10-CM | POA: Insufficient documentation

## 2021-07-24 DIAGNOSIS — I10 Essential (primary) hypertension: Secondary | ICD-10-CM | POA: Insufficient documentation

## 2021-07-24 DIAGNOSIS — Z79899 Other long term (current) drug therapy: Secondary | ICD-10-CM | POA: Diagnosis not present

## 2021-07-24 DIAGNOSIS — G43909 Migraine, unspecified, not intractable, without status migrainosus: Secondary | ICD-10-CM | POA: Diagnosis not present

## 2021-07-24 MED ORDER — CAMBIA 50 MG PO PACK: 50.0000 mg | PACK | Freq: Two times a day (BID) | ORAL | 1 refills | Status: DC | PRN

## 2021-07-24 MED ORDER — METOCLOPRAMIDE HCL 5 MG/ML IJ SOLN
10.0000 mg | Freq: Once | INTRAMUSCULAR | Status: AC
Start: 2021-07-24 — End: 2021-07-24
  Administered 2021-07-24: 10 mg via INTRAVENOUS
  Filled 2021-07-24: qty 2

## 2021-07-24 MED ORDER — SODIUM CHLORIDE 0.9 % IV BOLUS
1000.0000 mL | Freq: Once | INTRAVENOUS | Status: AC
  Administered 2021-07-24: 1000 mL via INTRAVENOUS

## 2021-07-24 MED ORDER — KETOROLAC TROMETHAMINE 30 MG/ML IJ SOLN
30.0000 mg | Freq: Once | INTRAMUSCULAR | Status: AC
  Administered 2021-07-24: 30 mg via INTRAVENOUS
  Filled 2021-07-24: qty 1

## 2021-07-24 MED ORDER — DIPHENHYDRAMINE HCL 50 MG/ML IJ SOLN
25.0000 mg | Freq: Once | INTRAMUSCULAR | Status: AC
  Administered 2021-07-24: 25 mg via INTRAVENOUS
  Filled 2021-07-24: qty 1

## 2021-07-24 NOTE — ED Provider Notes (Signed)
[place] EMERGENCY DEPT Provider Note   CSN: [id number] Arrival date & time: 07/24/21  1338     History Chief Complaint  Patient presents with   Migraine    Karen Dominguez is a 50 y.o. female.  The history is provided by the patient. No language interpreter was used.  Migraine This is a new problem. The current episode started yesterday. The problem occurs constantly. The problem has been gradually worsening. Nothing aggravates the symptoms. Nothing relieves the symptoms. She has tried nothing for the symptoms. The treatment provided no relief.    Pt reports she has had previous migraines.  Pt out of migraoine medication Pt takes Cambia   Past Medical History:  Diagnosis Date   Hypertension    Migraine     Patient Active Problem List   Diagnosis Date Noted   Anemia 02/06/2018   Sprain of medial collateral ligament of right knee 06/03/2017   Stress fracture of right tibia 05/07/2017    Past Surgical History:  Procedure Laterality Date   ABDOMINOPLASTY     APPENDECTOMY     CESAREAN SECTION     DILITATION & CURRETTAGE/HYSTROSCOPY WITH NOVASURE ABLATION N/A 02/05/2018   Procedure: DILATATION & CURETTAGE/HYSTEROSCOPY WITH NOVASURE ABLATION;  Surgeon: Olga Millers, MD;  Location: [facility];  Service: Gynecology;  Laterality: N/A;   TONSILLECTOMY       OB History     Gravida  3   Para  2   Term  2   Preterm      AB  1   Living  3      SAB      IAB      Ectopic      Multiple  1   Live Births              Family History  Problem Relation Age of Onset   CAD Father    Stroke Father    CAD Brother     Social History   Tobacco Use   Smoking status: Former    Years: 6.00    Types: Cigarettes   Smokeless tobacco: Never  Substance Use Topics   Alcohol use: Yes    Comment: occasional   Drug use: No    Home Medications Prior to Admission medications   Medication Sig Start Date End Date Taking? Authorizing Provider   Diclofenac Potassium,Migraine, (CAMBIA) 50 MG PACK Take 50 mg by mouth every 12 (twelve) hours as needed. 07/24/21  Yes Caryl Ada K, PA-C  hydrochlorothiazide (MICROZIDE) 12.5 MG capsule Take 12.5 mg by mouth daily.   Yes [provider]  losartan (COZAAR) 50 MG tablet TAKE 1 TABLET BY MOUTH EVERY DAY 12/12/20  Yes Crenshaw, Denice Bors, MD  Omega-3 Fatty Acids (FISH OIL PO) Take 2 capsules by mouth daily.   Yes [provider]  VITAMIN D, CHOLECALCIFEROL, PO Take 1 tablet by mouth daily.   Yes [provider]  BIOTIN PO Take 1 tablet by mouth daily.    [provider]  hydrochlorothiazide (MICROZIDE) 12.5 MG capsule Take 1 capsule (12.5 mg total) by mouth daily. 05/19/20 08/17/20  Lelon Perla, MD    Allergies    Erythromycin  Review of Systems   Review of Systems  All other systems reviewed and are negative.  Physical Exam Updated Vital Signs BP (!) 159/92 (BP Location: Right Arm)   Pulse (!) 57   Temp 98.4 F (36.9 C) (Oral)   Resp 18  Ht 5\' 2"  (1.575 m)   Wt 54 kg   SpO2 98%   BMI 21.77 kg/m   Physical Exam Vitals and nursing note reviewed.  Constitutional:      Appearance: She is well-developed.  HENT:     Head: Normocephalic.     Right Ear: Tympanic membrane normal.     Left Ear: Tympanic membrane normal.     Nose: Nose normal.     Mouth/Throat:     Mouth: Mucous membranes are moist.  Eyes:     Extraocular Movements: Extraocular movements intact.     Pupils: Pupils are equal, round, and reactive to light.  Cardiovascular:     Rate and Rhythm: Normal rate.  Pulmonary:     Effort: Pulmonary effort is normal.  Abdominal:     General: There is no distension.  Musculoskeletal:        General: Normal range of motion.     Cervical back: Normal range of motion.  Skin:    General: Skin is warm.  Neurological:     General: No focal deficit present.     Mental Status: She is alert and oriented to person, place, and time.     ED Results / Procedures / Treatments   Labs (all labs ordered are listed, but only abnormal results are displayed) Labs Reviewed - No data to display  EKG None  Radiology No results found.  Procedures Procedures   Medications Ordered in ED Medications  ketorolac (TORADOL) 30 MG/ML injection 30 mg (30 mg Intravenous Given 07/24/21 1644)  metoCLOPramide (REGLAN) injection 10 mg (10 mg Intravenous Given 07/24/21 1644)  diphenhydrAMINE (BENADRYL) injection 25 mg (25 mg Intravenous Given 07/24/21 1643)  sodium chloride 0.9 % bolus 1,000 mL (0 mLs Intravenous Stopped 07/24/21 1759)    ED Course  I have reviewed the triage vital signs and the nursing notes.  Pertinent labs & imaging results that were available during my care of the patient were reviewed by me and considered in my medical decision making (see chart for details).    MDM Rules/Calculators/A&P                           MDM:  Pt given IV fluids x 1 liter,  toradol, reglan and benadryl.  Pt reports feeling much better after medications.  Rx for Cambia  Final Clinical Impression(s) / ED Diagnoses Final diagnoses:  Other migraine without status migrainosus, not intractable    Rx / DC Orders ED Discharge Orders          Ordered    Diclofenac Potassium,Migraine, (CAMBIA) 50 MG PACK  Every 12 hours PRN        07/24/21 1755          An After Visit Summary was printed and given to the patient.    Fransico Meadow, PA-C 07/24/21 1806    Malvin Johns, MD 07/24/21 1907

## 2021-07-24 NOTE — Discharge Instructions (Addendum)
Return if any problems.

## 2021-07-24 NOTE — ED Triage Notes (Signed)
Pt reports waking yesterday morning with a migraine, hx of the same. Has not seen her neurologist for several months, unable to get in at this time. Pt reports she is out of her "usual migraine medications" Pt states recent move therefore she has been "jinky" about taking her BP meds x3 weeks.

## 2021-07-24 NOTE — ED Notes (Signed)
Pt states it has been several months since she has had a migraine this bad, states after uterine ablation migraines had subsided.  Pt reports migraine x 2 days has alternated tylenol and ibuprofen every 6 hrs until this am with no relief.  Pt normally takes Cambia and is currently out of medication.   +light sensitivity and nauseated

## 2021-08-10 ENCOUNTER — Ambulatory Visit
Admission: RE | Admit: 2021-08-10 | Discharge: 2021-08-10 | Disposition: A | Discharge status: Home / Self Care | Payer: BC Managed Care – PPO | Source: Ambulatory Visit | Attending: Obstetrics and Gynecology | Admitting: Obstetrics and Gynecology

## 2021-08-10 ENCOUNTER — Other Ambulatory Visit: Payer: Self-pay

## 2021-08-10 DIAGNOSIS — Z1231 Encounter for screening mammogram for malignant neoplasm of breast: Secondary | ICD-10-CM

## 2021-08-16 DIAGNOSIS — G43109 Migraine with aura, not intractable, without status migrainosus: Secondary | ICD-10-CM | POA: Diagnosis not present

## 2021-08-16 DIAGNOSIS — G43009 Migraine without aura, not intractable, without status migrainosus: Secondary | ICD-10-CM | POA: Diagnosis not present

## 2021-08-16 DIAGNOSIS — R519 Headache, unspecified: Secondary | ICD-10-CM | POA: Diagnosis not present

## 2021-08-17 DIAGNOSIS — M25649 Stiffness of unspecified hand, not elsewhere classified: Secondary | ICD-10-CM | POA: Diagnosis not present

## 2021-08-17 DIAGNOSIS — Z13220 Encounter for screening for lipoid disorders: Secondary | ICD-10-CM | POA: Diagnosis not present

## 2021-08-17 DIAGNOSIS — Z131 Encounter for screening for diabetes mellitus: Secondary | ICD-10-CM | POA: Diagnosis not present

## 2021-08-17 DIAGNOSIS — Z0000 Encounter for general adult medical examination without abnormal findings: Secondary | ICD-10-CM | POA: Diagnosis not present

## 2021-08-17 DIAGNOSIS — Z87891 Personal history of nicotine dependence: Secondary | ICD-10-CM | POA: Diagnosis not present

## 2021-08-23 ENCOUNTER — Other Ambulatory Visit: Payer: Self-pay | Admitting: Cardiology

## 2021-08-30 NOTE — Progress Notes (Signed)
HPI: FU hypertension and TAA.  Calcium score November 2020 0.  Ascending aorta mildly dilated at 40 mm.  MRA 2/22 showed etatic 3.8 cm thoracic aorta. Since last seen patient denies dyspnea, chest pain, palpitations or syncope.  Current Outpatient Medications  Medication Sig Dispense Refill   hydrochlorothiazide (MICROZIDE) 12.5 MG capsule TAKE 1 CAPSULE BY MOUTH EVERY DAY 90 capsule 3   losartan (COZAAR) 50 MG tablet TAKE 1 TABLET BY MOUTH EVERY DAY 90 tablet 3   Omega-3 Fatty Acids (FISH OIL PO) Take 2 capsules by mouth daily.     VITAMIN D, CHOLECALCIFEROL, PO Take 1 tablet by mouth daily.     BIOTIN PO Take 1 tablet by mouth daily. (Patient not taking: Reported on 09/11/2021)     Diclofenac Potassium,Migraine, (CAMBIA) 50 MG PACK Take 50 mg by mouth every 12 (twelve) hours as needed. (Patient not taking: Reported on 09/11/2021) 9 each 1   hydrochlorothiazide (MICROZIDE) 12.5 MG capsule Take 12.5 mg by mouth daily. (Patient not taking: Reported on 09/11/2021)     UBRELVY 100 MG TABS Take 1 tablet by mouth 2 (two) times daily as needed. (Patient not taking: Reported on 09/11/2021)     No current facility-administered medications for this visit.     Past Medical History:  Diagnosis Date   Hypertension    Migraine     Past Surgical History:  Procedure Laterality Date   ABDOMINOPLASTY     APPENDECTOMY     CESAREAN SECTION     DILITATION & CURRETTAGE/HYSTROSCOPY WITH NOVASURE ABLATION N/A 02/05/2018   Procedure: DILATATION & CURETTAGE/HYSTEROSCOPY WITH NOVASURE ABLATION;  Surgeon: Olga Millers, MD;  Location: [facility];  Service: Gynecology;  Laterality: N/A;   TONSILLECTOMY      Social History   Socioeconomic History   Marital status: Married    Spouse name: Not on file   Number of children: 3   Years of education: Not on file   Highest education level: Not on file  Occupational History   Not on file  Tobacco Use   Smoking status: Former    Years: 6.00    Types:  Cigarettes   Smokeless tobacco: Never  Substance and Sexual Activity   Alcohol use: Yes    Comment: occasional   Drug use: No   Sexual activity: Yes  Other Topics Concern   Not on file  Social History Narrative   Not on file   Social Determinants of Health   Financial Resource Strain: Not on file  Food Insecurity: Not on file  Transportation Needs: Not on file  Physical Activity: Not on file  Stress: Not on file  Social Connections: Not on file  Intimate Partner Violence: Not on file    Family History  Problem Relation Age of Onset   CAD Father    Stroke Father    CAD Brother     ROS: no fevers or chills, productive cough, hemoptysis, dysphasia, odynophagia, melena, hematochezia, dysuria, hematuria, rash, seizure activity, orthopnea, PND, pedal edema, claudication. Remaining systems are negative.  Physical Exam: Well-developed well-nourished in no acute distress.  Skin is warm and dry.  HEENT is normal.  Neck is supple.  Chest is clear to auscultation with normal expansion.  Cardiovascular exam is regular rate and rhythm.  Abdominal exam nontender or distended. No masses palpated. Extremities show no edema. neuro grossly intact  ECG-normal sinus rhythm at a rate of 68, nonspecific ST changes.  Personally reviewed  A/P  1  ectatic thoracic aorta-we will plan follow-up MRA February 2024.  I will arrange an echocardiogram to rule out bicuspid aortic valve.  2 hypertension-blood pressure controlled.  Continue present medical regimen.  3 history of snoring-patient states she will follow-up with [facility] for possible sleep study.  4 hyperlipidemia-recent blood work showed LDL 158.  She does not have documented vascular disease or diabetes mellitus.  She will work on her diet and we will plan to repeat lipids in 4 months.  Kirk Ruths, MD

## 2021-09-05 DIAGNOSIS — R519 Headache, unspecified: Secondary | ICD-10-CM | POA: Diagnosis not present

## 2021-09-11 ENCOUNTER — Encounter: Payer: Self-pay | Admitting: Cardiology

## 2021-09-11 ENCOUNTER — Ambulatory Visit: Payer: BC Managed Care – PPO | Admitting: Cardiology

## 2021-09-11 ENCOUNTER — Other Ambulatory Visit: Payer: Self-pay

## 2021-09-11 VITALS — BP 123/77 | HR 68 | Ht 62.0 in | Wt 128.4 lb

## 2021-09-11 DIAGNOSIS — I10 Essential (primary) hypertension: Secondary | ICD-10-CM

## 2021-09-11 DIAGNOSIS — E781 Pure hyperglyceridemia: Secondary | ICD-10-CM

## 2021-09-11 DIAGNOSIS — I7123 Aneurysm of the descending thoracic aorta, without rupture: Secondary | ICD-10-CM

## 2021-09-11 NOTE — Patient Instructions (Signed)
  Lab Work:  Your physician recommends that you return for lab work in: [place]  If you have labs (blood work) drawn today and your tests are completely normal, you will receive your results only by: MyChart Message (if you have Daniel) OR A paper copy in the mail If you have any lab test that is abnormal or we need to change your treatment, we will call you to review the results.   Testing/Procedures:  Your physician has requested that you have an echocardiogram. Echocardiography is a painless test that uses sound waves to create images of your heart. It provides your doctor with information about the size and shape of your heart and how well your heart's chambers and valves are working. This procedure takes approximately one hour. There are no restrictions for this procedure. [place]   Follow-Up: At [facility], you and your health needs are our priority.  As part of our continuing mission to provide you with exceptional heart care, we have created designated Provider Care Teams.  These Care Teams include your primary Cardiologist (physician) and Advanced Practice Providers (APPs -  Physician Assistants and Nurse Practitioners) who all work together to provide you with the care you need, when you need it.  We recommend signing up for the patient portal called "MyChart".  Sign up information is provided on this After Visit Summary.  MyChart is used to connect with patients for Virtual Visits (Telemedicine).  Patients are able to view lab/test results, encounter notes, upcoming appointments, etc.  Non-urgent messages can be sent to your provider as well.   To learn more about what you can do with MyChart, go to [email or web address].    Your next appointment:   12 month(s)  The format for your next appointment:   In Person  Provider:   Kirk Ruths MD

## 2021-09-28 ENCOUNTER — Ambulatory Visit (HOSPITAL_COMMUNITY): Payer: BC Managed Care – PPO | Attending: Internal Medicine

## 2021-09-28 ENCOUNTER — Other Ambulatory Visit: Payer: Self-pay

## 2021-09-28 DIAGNOSIS — I7123 Aneurysm of the descending thoracic aorta, without rupture: Secondary | ICD-10-CM | POA: Diagnosis not present

## 2021-09-28 LAB — ECHOCARDIOGRAM COMPLETE
Area-P 1/2: 3.36 cm2
S' Lateral: 2.6 cm

## 2021-12-19 NOTE — Progress Notes (Deleted)
? ? ? ? ?HPI: FU hypertension and TAA.  Calcium score November 2020 0.  Ascending aorta mildly dilated at 40 mm.  MRA 2/22 showed etatic 3.8 cm thoracic aorta.  Echocardiogram December 2022 showed normal LV function, moderate left atrial enlargement.  Since last seen  ? ?Current Outpatient Medications  ?Medication Sig Dispense Refill  ? BIOTIN PO Take 1 tablet by mouth daily. (Patient not taking: Reported on 09/11/2021)    ? Diclofenac Potassium,Migraine, (CAMBIA) 50 MG PACK Take 50 mg by mouth every 12 (twelve) hours as needed. (Patient not taking: Reported on 09/11/2021) 9 each 1  ? hydrochlorothiazide (MICROZIDE) 12.5 MG capsule Take 12.5 mg by mouth daily. (Patient not taking: Reported on 09/11/2021)    ? hydrochlorothiazide (MICROZIDE) 12.5 MG capsule TAKE 1 CAPSULE BY MOUTH EVERY DAY 90 capsule 3  ? losartan (COZAAR) 50 MG tablet TAKE 1 TABLET BY MOUTH EVERY DAY 90 tablet 3  ? Omega-3 Fatty Acids (FISH OIL PO) Take 2 capsules by mouth daily.    ? UBRELVY 100 MG TABS Take 1 tablet by mouth 2 (two) times daily as needed. (Patient not taking: Reported on 09/11/2021)    ? VITAMIN D, CHOLECALCIFEROL, PO Take 1 tablet by mouth daily.    ? ?No current facility-administered medications for this visit.  ? ? ? ?Past Medical History:  ?Diagnosis Date  ? Hypertension   ? Migraine   ? ? ?Past Surgical History:  ?Procedure Laterality Date  ? ABDOMINOPLASTY    ? APPENDECTOMY    ? CESAREAN SECTION    ? DILITATION & CURRETTAGE/HYSTROSCOPY WITH NOVASURE ABLATION N/A 02/05/2018  ? Procedure: DILATATION & CURETTAGE/HYSTEROSCOPY WITH NOVASURE ABLATION;  Surgeon: Olga Millers, MD;  Location: [facility];  Service: Gynecology;  Laterality: N/A;  ? TONSILLECTOMY    ? ? ?Social History  ? ?Socioeconomic History  ? Marital status: Married  ?  Spouse name: Not on file  ? Number of children: 3  ? Years of education: Not on file  ? Highest education level: Not on file  ?Occupational History  ? Not on file  ?Tobacco Use  ? Smoking status: Former   ?  Years: 6.00  ?  Types: Cigarettes  ? Smokeless tobacco: Never  ?Substance and Sexual Activity  ? Alcohol use: Yes  ?  Comment: occasional  ? Drug use: No  ? Sexual activity: Yes  ?Other Topics Concern  ? Not on file  ?Social History Narrative  ? Not on file  ? ?Social Determinants of Health  ? ?Financial Resource Strain: Not on file  ?Food Insecurity: Not on file  ?Transportation Needs: Not on file  ?Physical Activity: Not on file  ?Stress: Not on file  ?Social Connections: Not on file  ?Intimate Partner Violence: Not on file  ? ? ?Family History  ?Problem Relation Age of Onset  ? CAD Father   ? Stroke Father   ? CAD Brother   ? ? ?ROS: no fevers or chills, productive cough, hemoptysis, dysphasia, odynophagia, melena, hematochezia, dysuria, hematuria, rash, seizure activity, orthopnea, PND, pedal edema, claudication. Remaining systems are negative. ? ?Physical Exam: ?Well-developed well-nourished in no acute distress.  ?Skin is warm and dry.  ?HEENT is normal.  ?Neck is supple.  ?Chest is clear to auscultation with normal expansion.  ?Cardiovascular exam is regular rate and rhythm.  ?Abdominal exam nontender or distended. No masses palpated. ?Extremities show no edema. ?neuro grossly intact ? ?ECG- personally reviewed ? ?A/P ? ?1 ectatic thoracic aorta-plan follow-up MRA  February 2024. ? ?2 hypertension-blood pressure controlled today.  Continue present medications and follow-up. ? ?3 hyperlipidemia-repeat lipids. ? ?Kirk Ruths, MD ? ? ? ?

## 2021-12-26 ENCOUNTER — Ambulatory Visit: Payer: BC Managed Care – PPO | Admitting: Cardiology

## 2021-12-30 ENCOUNTER — Other Ambulatory Visit: Payer: Self-pay | Admitting: Cardiology

## 2021-12-30 DIAGNOSIS — I10 Essential (primary) hypertension: Secondary | ICD-10-CM

## 2022-01-19 DIAGNOSIS — L237 Allergic contact dermatitis due to plants, except food: Secondary | ICD-10-CM | POA: Diagnosis not present

## 2022-01-25 DIAGNOSIS — L237 Allergic contact dermatitis due to plants, except food: Secondary | ICD-10-CM | POA: Diagnosis not present

## 2022-03-07 DIAGNOSIS — N3001 Acute cystitis with hematuria: Secondary | ICD-10-CM | POA: Diagnosis not present

## 2022-05-24 DIAGNOSIS — L821 Other seborrheic keratosis: Secondary | ICD-10-CM | POA: Diagnosis not present

## 2022-05-24 DIAGNOSIS — L814 Other melanin hyperpigmentation: Secondary | ICD-10-CM | POA: Diagnosis not present

## 2022-05-24 DIAGNOSIS — D1801 Hemangioma of skin and subcutaneous tissue: Secondary | ICD-10-CM | POA: Diagnosis not present

## 2022-05-24 DIAGNOSIS — L578 Other skin changes due to chronic exposure to nonionizing radiation: Secondary | ICD-10-CM | POA: Diagnosis not present

## 2022-06-19 DIAGNOSIS — N951 Menopausal and female climacteric states: Secondary | ICD-10-CM | POA: Diagnosis not present

## 2022-06-19 DIAGNOSIS — Z6823 Body mass index (BMI) 23.0-23.9, adult: Secondary | ICD-10-CM | POA: Diagnosis not present

## 2022-06-19 DIAGNOSIS — Z01419 Encounter for gynecological examination (general) (routine) without abnormal findings: Secondary | ICD-10-CM | POA: Diagnosis not present

## 2022-07-09 ENCOUNTER — Other Ambulatory Visit: Payer: Self-pay | Admitting: Obstetrics and Gynecology

## 2022-07-09 DIAGNOSIS — Z1231 Encounter for screening mammogram for malignant neoplasm of breast: Secondary | ICD-10-CM

## 2022-07-17 ENCOUNTER — Ambulatory Visit: Payer: 59 | Attending: Cardiology | Admitting: Cardiology

## 2022-07-17 ENCOUNTER — Encounter: Payer: Self-pay | Admitting: Cardiology

## 2022-07-17 VITALS — BP 124/84 | HR 50 | Ht 62.0 in | Wt 129.8 lb

## 2022-07-17 DIAGNOSIS — I7123 Aneurysm of the descending thoracic aorta, without rupture: Secondary | ICD-10-CM

## 2022-07-17 DIAGNOSIS — I10 Essential (primary) hypertension: Secondary | ICD-10-CM

## 2022-07-17 DIAGNOSIS — E781 Pure hyperglyceridemia: Secondary | ICD-10-CM

## 2022-07-17 LAB — BASIC METABOLIC PANEL
BUN/Creatinine Ratio: 19 (ref 9–23)
BUN: 17 mg/dL (ref 6–24)
CO2: 27 mmol/L (ref 20–29)
Calcium: 9.3 mg/dL (ref 8.7–10.2)
Chloride: 104 mmol/L (ref 96–106)
Creatinine, Ser: 0.9 mg/dL (ref 0.57–1.00)
Glucose: 77 mg/dL (ref 70–99)
Potassium: 3.9 mmol/L (ref 3.5–5.2)
Sodium: 142 mmol/L (ref 134–144)
eGFR: 76 mL/min/{1.73_m2} (ref >59)

## 2022-07-17 LAB — LIPID PANEL
Chol/HDL Ratio: 3.3 ratio (ref 0.0–4.4)
Cholesterol, Total: 247 mg/dL — ABNORMAL HIGH (ref 100–199)
HDL: 74 mg/dL (ref >39)
LDL Chol Calc (NIH): 164 mg/dL — ABNORMAL HIGH (ref 0–99)
Triglycerides: 56 mg/dL (ref 0–149)
VLDL Cholesterol Cal: 9 mg/dL (ref 5–40)

## 2022-07-17 NOTE — Progress Notes (Signed)
HPI: FU hypertension and TAA.  Calcium score November 2020 0.  Ascending aorta mildly dilated at 40 mm.  MRA 2/22 showed etatic 3.8 cm thoracic aorta.  Echocardiogram December 2022 showed normal LV function, moderate left atrial enlargement.  Since last seen the patient has dyspnea with more extreme activities but not with routine activities. It is relieved with rest. It is not associated with chest pain. There is no orthopnea, PND or pedal edema. There is no syncope or palpitations. There is no exertional chest pain.   Current Outpatient Medications  Medication Sig Dispense Refill   hydrochlorothiazide (MICROZIDE) 12.5 MG capsule TAKE 1 CAPSULE BY MOUTH EVERY DAY 90 capsule 3   losartan (COZAAR) 50 MG tablet TAKE 1 TABLET BY MOUTH EVERY DAY 90 tablet 2   Omega-3 Fatty Acids (FISH OIL PO) Take 2 capsules by mouth daily.     UBRELVY 100 MG TABS Take 1 tablet by mouth 2 (two) times daily as needed.     VITAMIN D, CHOLECALCIFEROL, PO Take 1 tablet by mouth daily.     No current facility-administered medications for this visit.     Past Medical History:  Diagnosis Date   Hypertension    Migraine     Past Surgical History:  Procedure Laterality Date   ABDOMINOPLASTY     APPENDECTOMY     CESAREAN SECTION     DILITATION & CURRETTAGE/HYSTROSCOPY WITH NOVASURE ABLATION N/A 02/05/2018   Procedure: DILATATION & CURETTAGE/HYSTEROSCOPY WITH NOVASURE ABLATION;  Surgeon: Olga Millers, MD;  Location: [facility];  Service: Gynecology;  Laterality: N/A;   TONSILLECTOMY      Social History   Socioeconomic History   Marital status: Married    Spouse name: Not on file   Number of children: 3   Years of education: Not on file   Highest education level: Not on file  Occupational History   Not on file  Tobacco Use   Smoking status: Former    Years: 6.00    Types: Cigarettes   Smokeless tobacco: Never  Substance and Sexual Activity   Alcohol use: Yes    Comment: occasional   Drug use:  No   Sexual activity: Yes  Other Topics Concern   Not on file  Social History Narrative   Not on file   Social Determinants of Health   Financial Resource Strain: Not on file  Food Insecurity: Not on file  Transportation Needs: Not on file  Physical Activity: Not on file  Stress: Not on file  Social Connections: Not on file  Intimate Partner Violence: Not on file    Family History  Problem Relation Age of Onset   CAD Father    Stroke Father    CAD Brother     ROS: no fevers or chills, productive cough, hemoptysis, dysphasia, odynophagia, melena, hematochezia, dysuria, hematuria, rash, seizure activity, orthopnea, PND, pedal edema, claudication. Remaining systems are negative.  Physical Exam: Well-developed well-nourished in no acute distress.  Skin is warm and dry.  HEENT is normal.  Neck is supple.  Chest is clear to auscultation with normal expansion.  Cardiovascular exam is regular rate and rhythm.  Abdominal exam nontender or distended. No masses palpated. Extremities show no edema. neuro grossly intact  ECG-sinus bradycardia at a rate of 50, no ST changes.  Personally reviewed  A/P  1 ectatic thoracic aorta-plan follow-up MRA February 2024.  2 hypertension-blood pressure controlled.  Continue present medications and follow.  Check potassium and renal function.  3 hyperlipidemia-recheck lipids.  Note she is not on therapy at this point as she does not have a history of documented vascular disease.  Kirk Ruths, MD

## 2022-07-17 NOTE — Patient Instructions (Signed)
    Follow-Up: At [facility], you and your health needs are our priority.  As part of our continuing mission to provide you with exceptional heart care, we have created designated Provider Care Teams.  These Care Teams include your primary Cardiologist (physician) and Advanced Practice Providers (APPs -  Physician Assistants and Nurse Practitioners) who all work together to provide you with the care you need, when you need it.  We recommend signing up for the patient portal called "MyChart".  Sign up information is provided on this After Visit Summary.  MyChart is used to connect with patients for Virtual Visits (Telemedicine).  Patients are able to view lab/test results, encounter notes, upcoming appointments, etc.  Non-urgent messages can be sent to your provider as well.   To learn more about what you can do with MyChart, go to [email or web address].    Your next appointment:   12 month(s)  The format for your next appointment:   In Person  Provider:   Brian Crenshaw MD          

## 2022-07-20 ENCOUNTER — Telehealth: Payer: Self-pay | Admitting: *Deleted

## 2022-07-20 DIAGNOSIS — E781 Pure hyperglyceridemia: Secondary | ICD-10-CM

## 2022-07-20 MED ORDER — ROSUVASTATIN CALCIUM 20 MG PO TABS: 20.0000 mg | ORAL_TABLET | Freq: Every day | ORAL | 3 refills | Status: AC

## 2022-07-20 NOTE — Telephone Encounter (Signed)
-----   Message from Lelon Perla, MD sent at 07/18/2022  7:50 AM EDT ----- Add crestor 20 mg daily; lipids and liver 8 weeks Kirk Ruths

## 2022-07-20 NOTE — Telephone Encounter (Signed)
pt aware of results  New script sent to the pharmacy  Lab orders mailed to the pt  

## 2022-08-13 ENCOUNTER — Ambulatory Visit: Payer: BC Managed Care – PPO

## 2022-08-16 ENCOUNTER — Ambulatory Visit: Payer: BC Managed Care – PPO | Admitting: Cardiology

## 2022-10-03 ENCOUNTER — Telehealth: Payer: Self-pay

## 2022-10-03 ENCOUNTER — Ambulatory Visit (INDEPENDENT_AMBULATORY_CARE_PROVIDER_SITE_OTHER): Payer: Self-pay | Admitting: Plastic Surgery

## 2022-10-03 ENCOUNTER — Encounter: Payer: Self-pay | Admitting: Plastic Surgery

## 2022-10-03 VITALS — BP 133/85 | HR 71 | Ht 62.0 in | Wt 131.2 lb

## 2022-10-03 DIAGNOSIS — Z719 Counseling, unspecified: Secondary | ICD-10-CM | POA: Insufficient documentation

## 2022-10-03 NOTE — Progress Notes (Signed)
Patient ID: TING CAGE, female    DOB: 1970-11-26, 51 y.o.   MRN: [id number]   Chief Complaint  Patient presents with   Consult    The patient is a 51 year old female here for evaluation of her breast.  She is 5 feet 2 inches tall weighs 131 pounds.  In 2018 she underwent a mommy makeover with Dr. Luvenia Heller.  She had saline implants placed.  As far she knows they are under the muscle.  She is not sure what size they are.  She also had an abdominoplasty.  She is not happy with her breast size.  She has ptosis at least grade 2.  She feels her breasts are too big and would like to have the implants removed.  She probably does not want to have new implants placed back.  She is otherwise in good health.  Her other surgeries include an appendectomy and tonsillectomy.  Her past medical history is positive for hypertension and hyperlipidemia.  The sternal notch to nipple distance on the right is 26 cm and approximately 6 the same on the left but looks slightly different.  The areola to inframammary fold is 10 cm.    Review of Systems  Constitutional: Negative.   HENT: Negative.    Eyes: Negative.   Respiratory: Negative.  Negative for chest tightness.   Cardiovascular: Negative.   Gastrointestinal: Negative.   Endocrine: Negative.   Genitourinary: Negative.   Musculoskeletal: Negative.     Past Medical History:  Diagnosis Date   Hypertension    Migraine     Past Surgical History:  Procedure Laterality Date   ABDOMINOPLASTY     APPENDECTOMY     CESAREAN SECTION     DILITATION & CURRETTAGE/HYSTROSCOPY WITH NOVASURE ABLATION N/A 02/05/2018   Procedure: DILATATION & CURETTAGE/HYSTEROSCOPY WITH NOVASURE ABLATION;  Surgeon: Olga Millers, MD;  Location: [facility];  Service: Gynecology;  Laterality: N/A;   TONSILLECTOMY        Current Outpatient Medications:    hydrochlorothiazide (MICROZIDE) 12.5 MG capsule, TAKE 1 CAPSULE BY MOUTH EVERY DAY, Disp: 90 capsule, Rfl: 3   losartan (COZAAR)  50 MG tablet, TAKE 1 TABLET BY MOUTH EVERY DAY, Disp: 90 tablet, Rfl: 2   Omega-3 Fatty Acids (FISH OIL PO), Take 2 capsules by mouth daily., Disp: , Rfl:    rosuvastatin (CRESTOR) 20 MG tablet, Take 1 tablet (20 mg total) by mouth daily., Disp: 90 tablet, Rfl: 3   UBRELVY 100 MG TABS, Take 1 tablet by mouth 2 (two) times daily as needed., Disp: , Rfl:    VITAMIN D, CHOLECALCIFEROL, PO, Take 1 tablet by mouth daily., Disp: , Rfl:    Objective:   Vitals:   10/03/22 1327  BP: 133/85  Pulse: 71  SpO2: 97%    Physical Exam Vitals and nursing note reviewed.  Constitutional:      Appearance: Normal appearance.  Cardiovascular:     Rate and Rhythm: Normal rate.     Pulses: Normal pulses.  Pulmonary:     Effort: Pulmonary effort is normal.  Abdominal:     General: There is no distension.     Palpations: Abdomen is soft.     Tenderness: There is no abdominal tenderness.  Musculoskeletal:        General: No swelling or deformity.  Skin:    General: Skin is warm.     Capillary Refill: Capillary refill takes less than 2 seconds.     Coloration: Skin  is not jaundiced.     Findings: No bruising.  Neurological:     Mental Status: She is alert and oriented to person, place, and time.  Psychiatric:        Mood and Affect: Mood normal.        Behavior: Behavior normal.        Thought Content: Thought content normal.        Judgment: Judgment normal.     Assessment & Plan:  Encounter for counseling  I think the patient is a good candidate for a mastopexy.  Certainly will be easier in the long run if she does not have the implants put back in.  She would like to have them removed.  We will provide her with a quote.  Pictures were obtained of the patient and placed in the chart with the patient's or guardian's permission.  Bernie, DO

## 2022-10-03 NOTE — Telephone Encounter (Signed)
Tried to call Dr. Luvenia Heller with [facility] for a copy of patient's implant information card. Patty stated they do not have a fax machine and IF she can find a copy of patient's card she will mail it.

## 2022-10-05 ENCOUNTER — Institutional Professional Consult (permissible substitution): Payer: BC Managed Care – PPO | Admitting: Plastic Surgery

## 2022-10-15 ENCOUNTER — Encounter: Payer: Self-pay | Admitting: *Deleted

## 2022-10-22 ENCOUNTER — Encounter: Payer: Self-pay | Admitting: *Deleted

## 2022-11-14 ENCOUNTER — Telehealth: Payer: Self-pay | Admitting: Plastic Surgery

## 2022-11-14 NOTE — Telephone Encounter (Signed)
TR asked if patient is ready to schedule - she advised she will be going to dr. Lenon Curt for surgery - removing patient from routes

## 2023-07-07 ENCOUNTER — Encounter (HOSPITAL_BASED_OUTPATIENT_CLINIC_OR_DEPARTMENT_OTHER): Payer: Self-pay

## 2023-07-07 ENCOUNTER — Other Ambulatory Visit: Payer: Self-pay

## 2023-07-07 ENCOUNTER — Emergency Department (HOSPITAL_BASED_OUTPATIENT_CLINIC_OR_DEPARTMENT_OTHER)
Admission: EM | Admit: 2023-07-07 | Discharge: 2023-07-08 | Disposition: A | Discharge status: Home / Self Care | Payer: 59 | Attending: Emergency Medicine | Admitting: Emergency Medicine

## 2023-07-07 DIAGNOSIS — W57XXXA Bitten or stung by nonvenomous insect and other nonvenomous arthropods, initial encounter: Secondary | ICD-10-CM | POA: Insufficient documentation

## 2023-07-07 DIAGNOSIS — L03114 Cellulitis of left upper limb: Secondary | ICD-10-CM | POA: Insufficient documentation

## 2023-07-07 DIAGNOSIS — I160 Hypertensive urgency: Secondary | ICD-10-CM | POA: Diagnosis not present

## 2023-07-07 DIAGNOSIS — I10 Essential (primary) hypertension: Secondary | ICD-10-CM | POA: Diagnosis not present

## 2023-07-07 DIAGNOSIS — Z79899 Other long term (current) drug therapy: Secondary | ICD-10-CM | POA: Insufficient documentation

## 2023-07-07 DIAGNOSIS — R2232 Localized swelling, mass and lump, left upper limb: Secondary | ICD-10-CM | POA: Diagnosis present

## 2023-07-07 MED ORDER — DOXYCYCLINE HYCLATE 100 MG PO TABS
100.0000 mg | ORAL_TABLET | Freq: Once | ORAL | Status: AC
  Administered 2023-07-07: 100 mg via ORAL
  Filled 2023-07-07: qty 1

## 2023-07-07 MED ORDER — DOXYCYCLINE HYCLATE 100 MG PO CAPS: 100.0000 mg | ORAL_CAPSULE | Freq: Two times a day (BID) | ORAL | 0 refills | Status: DC

## 2023-07-07 NOTE — ED Triage Notes (Signed)
Pt states that she was working in her yard yesterday and got bitten by fire ants, bites to both feet and L arm, pt took benadryl and hydrocortisone cream without relief

## 2023-07-07 NOTE — ED Provider Notes (Signed)
  [place] EMERGENCY DEPARTMENT AT [facility] Provider Note   CSN: [id number] Arrival date & time: 07/07/23  2040     History {Add pertinent medical, surgical, social history, OB history to HPI:1} Chief Complaint  Patient presents with   Insect Bite    Karen FRANQUI is a 52 y.o. female.  HPI     Home Medications Prior to Admission medications   Medication Sig Start Date End Date Taking? Authorizing Provider  doxycycline (VIBRAMYCIN) 100 MG capsule Take 1 capsule (100 mg total) by mouth 2 (two) times daily. 07/07/23  Yes Dezerae Freiberger, Mayer Masker, MD  hydrochlorothiazide (MICROZIDE) 12.5 MG capsule TAKE 1 CAPSULE BY MOUTH EVERY DAY 08/23/21   Lewayne Bunting, MD  losartan (COZAAR) 50 MG tablet TAKE 1 TABLET BY MOUTH EVERY DAY 01/01/22   Lewayne Bunting, MD  Omega-3 Fatty Acids (FISH OIL PO) Take 2 capsules by mouth daily.    [provider]  rosuvastatin (CRESTOR) 20 MG tablet Take 1 tablet (20 mg total) by mouth daily. 07/20/22   Lewayne Bunting, MD  UBRELVY 100 MG TABS Take 1 tablet by mouth 2 (two) times daily as needed. 08/16/21   [provider]  VITAMIN D, CHOLECALCIFEROL, PO Take 1 tablet by mouth daily.    [provider]      Allergies    Erythromycin    Review of Systems   Review of Systems  Physical Exam Updated Vital Signs BP (!) 163/106   Pulse 74   Temp 98.1 F (36.7 C) (Oral)   Resp 20   SpO2 98%  Physical Exam  ED Results / Procedures / Treatments   Labs (all labs ordered are listed, but only abnormal results are displayed) Labs Reviewed - No data to display  EKG None  Radiology No results found.  Procedures Procedures  {Document cardiac monitor, telemetry assessment procedure when appropriate:1}  Medications Ordered in ED Medications  doxycycline (VIBRA-TABS) tablet 100 mg (100 mg Oral Given 07/07/23 2355)    ED Course/ Medical Decision Making/ A&P   {   Click here for ABCD2, HEART and other  calculatorsREFRESH Note before signing :1}                              Medical Decision Making Risk Prescription drug management.   ***  {Document critical care time when appropriate:1} {Document review of labs and clinical decision tools ie heart score, Chads2Vasc2 etc:1}  {Document your independent review of radiology images, and any outside records:1} {Document your discussion with family members, caretakers, and with consultants:1} {Document social determinants of health affecting pt's care:1} {Document your decision making why or why not admission, treatments were needed:1} Final Clinical Impression(s) / ED Diagnoses Final diagnoses:  Cellulitis of left upper extremity    Rx / DC Orders ED Discharge Orders          Ordered    doxycycline (VIBRAMYCIN) 100 MG capsule  2 times daily        07/07/23 2358

## 2023-07-07 NOTE — Discharge Instructions (Signed)
You were seen today with concern for infection of the left upper extremity.  This is likely a cellulitis which is bacterial in nature.  There is a small chance that you could have contracted a fungal infection from a rose thorn; however, this is less likely given how quickly this came on.  Take antibiotics as prescribed.  If you have progression of symptoms, fevers, increasing redness, you should be reevaluated.

## 2023-07-08 ENCOUNTER — Emergency Department (HOSPITAL_BASED_OUTPATIENT_CLINIC_OR_DEPARTMENT_OTHER)
Admission: EM | Admit: 2023-07-08 | Discharge: 2023-07-09 | Disposition: A | Discharge status: Home / Self Care | Payer: 59 | Source: Home / Self Care | Attending: Emergency Medicine | Admitting: Emergency Medicine

## 2023-07-08 ENCOUNTER — Other Ambulatory Visit: Payer: Self-pay

## 2023-07-08 ENCOUNTER — Encounter (HOSPITAL_BASED_OUTPATIENT_CLINIC_OR_DEPARTMENT_OTHER): Payer: Self-pay | Admitting: *Deleted

## 2023-07-08 ENCOUNTER — Emergency Department (HOSPITAL_BASED_OUTPATIENT_CLINIC_OR_DEPARTMENT_OTHER): Payer: 59 | Admitting: Radiology

## 2023-07-08 DIAGNOSIS — Z79899 Other long term (current) drug therapy: Secondary | ICD-10-CM | POA: Insufficient documentation

## 2023-07-08 DIAGNOSIS — I160 Hypertensive urgency: Secondary | ICD-10-CM

## 2023-07-08 DIAGNOSIS — I10 Essential (primary) hypertension: Secondary | ICD-10-CM | POA: Insufficient documentation

## 2023-07-08 LAB — CBC
HCT: 36 % (ref 36.0–46.0)
Hemoglobin: 12.7 g/dL (ref 12.0–15.0)
MCH: 31.7 pg (ref 26.0–34.0)
MCHC: 35.3 g/dL (ref 30.0–36.0)
MCV: 89.8 fL (ref 80.0–100.0)
Platelets: 221 10*3/uL (ref 150–400)
RBC: 4.01 MIL/uL (ref 3.87–5.11)
RDW: 12.5 % (ref 11.5–15.5)
WBC: 5 10*3/uL (ref 4.0–10.5)
nRBC: 0 % (ref 0.0–0.2)

## 2023-07-08 LAB — BASIC METABOLIC PANEL
Anion gap: 8 (ref 5–15)
BUN: 16 mg/dL (ref 6–20)
CO2: 27 mmol/L (ref 22–32)
Calcium: 9.3 mg/dL (ref 8.9–10.3)
Chloride: 104 mmol/L (ref 98–111)
Creatinine, Ser: 0.78 mg/dL (ref 0.44–1.00)
GFR, Estimated: >60 mL/min (ref >60)
Glucose, Bld: 118 mg/dL — ABNORMAL HIGH (ref 70–99)
Potassium: 3.5 mmol/L (ref 3.5–5.1)
Sodium: 139 mmol/L (ref 135–145)

## 2023-07-08 LAB — TROPONIN I (HIGH SENSITIVITY): Troponin I (High Sensitivity): 3 ng/L (ref <18)

## 2023-07-08 NOTE — ED Triage Notes (Signed)
Pt is here for evaluation of hypertension.  She states that she was having HA and feeling unwell and checked her BP and it was 198/111 and she took an additional dose of of her BP meds (hydrochlorothiazide and losartan).  Pt has had some dizziness and feeling tired and "off today"  Pt was seen last pm due to cellulitis and was placed on doxycycline last pm.  Pt states that she has slight increase in redness on her left arm.

## 2023-07-09 MED ORDER — CLONIDINE HCL 0.1 MG PO TABS: 0.1000 mg | ORAL_TABLET | Freq: Two times a day (BID) | ORAL | 0 refills | Status: DC | PRN

## 2023-07-09 NOTE — Discharge Instructions (Signed)
Continue medications as previously prescribed.  If your blood pressures are running 180 or greater consistently and not improving, take a dose of the clonidine you have been prescribed this evening.  Record of your blood pressures over the next week and take this with you to your follow-up doctors appointment.  Return to the ER if you experience any additional issues or problems.

## 2023-07-09 NOTE — ED Provider Notes (Signed)
[facility] AT [facility] Provider Note   CSN: [id number] Arrival date & time: 07/08/23  1930     History  Chief Complaint  Patient presents with   Hypertension    Karen Dominguez is a 52 y.o. female.  Patient is a 52 year old female with history of hypertension, hyperlipidemia.  Patient presenting today for evaluation of elevated blood pressure.  She began feeling poorly this evening with some headache.  She took her blood pressure and it was 190 systolic.  She continued to take it and got similar readings, then presents for evaluation of this.  She denies to me she is having any chest pain or difficulty breathing.  No other complaints.  The history is provided by the patient.       Home Medications Prior to Admission medications   Medication Sig Start Date End Date Taking? Authorizing Provider  doxycycline (VIBRAMYCIN) 100 MG capsule Take 1 capsule (100 mg total) by mouth 2 (two) times daily. 07/07/23   Horton, Mayer Masker, MD  hydrochlorothiazide (MICROZIDE) 12.5 MG capsule TAKE 1 CAPSULE BY MOUTH EVERY DAY 08/23/21   Lewayne Bunting, MD  losartan (COZAAR) 50 MG tablet TAKE 1 TABLET BY MOUTH EVERY DAY 01/01/22   Lewayne Bunting, MD  Omega-3 Fatty Acids (FISH OIL PO) Take 2 capsules by mouth daily.    [provider]  rosuvastatin (CRESTOR) 20 MG tablet Take 1 tablet (20 mg total) by mouth daily. 07/20/22   Lewayne Bunting, MD  UBRELVY 100 MG TABS Take 1 tablet by mouth 2 (two) times daily as needed. 08/16/21   [provider]  VITAMIN D, CHOLECALCIFEROL, PO Take 1 tablet by mouth daily.    [provider]      Allergies    Erythromycin    Review of Systems   Review of Systems  All other systems reviewed and are negative.   Physical Exam Updated Vital Signs BP (!) 184/127   Pulse 78   Temp 98.9 F (37.2 C)   Resp 18   SpO2 97%  Physical Exam Vitals and nursing note reviewed.  Constitutional:       General: She is not in acute distress.    Appearance: She is well-developed. She is not diaphoretic.  HENT:     Head: Normocephalic and atraumatic.  Eyes:     Extraocular Movements: Extraocular movements intact.     Pupils: Pupils are equal, round, and reactive to light.  Cardiovascular:     Rate and Rhythm: Normal rate and regular rhythm.     Heart sounds: No murmur heard.    No friction rub. No gallop.  Pulmonary:     Effort: Pulmonary effort is normal. No respiratory distress.     Breath sounds: Normal breath sounds. No wheezing.  Abdominal:     General: Bowel sounds are normal. There is no distension.     Palpations: Abdomen is soft.     Tenderness: There is no abdominal tenderness.  Musculoskeletal:        General: Normal range of motion.     Cervical back: Normal range of motion and neck supple.  Skin:    General: Skin is warm and dry.  Neurological:     General: No focal deficit present.     Mental Status: She is alert and oriented to person, place, and time.     Cranial Nerves: No cranial nerve deficit.     ED Results / Procedures / Treatments  Labs (all labs ordered are listed, but only abnormal results are displayed) Labs Reviewed  BASIC METABOLIC PANEL - Abnormal; Notable for the following components:      Result Value   Glucose, Bld 118 (*)    All other components within normal limits  CBC  TROPONIN I (HIGH SENSITIVITY)  TROPONIN I (HIGH SENSITIVITY)    EKG ED ECG REPORT   Date: 07/09/2023  Rate: 70  Rhythm: normal sinus rhythm  QRS Axis: normal  Intervals: normal  ST/T Wave abnormalities: normal  Conduction Disutrbances:none  Narrative Interpretation:   Old EKG Reviewed: none available  I have personally reviewed the EKG tracing and agree with the computerized printout as noted.   Radiology DG Chest 2 View  Result Date: 07/08/2023 CLINICAL DATA:  hypertension, nausea EXAM: CHEST - 2 VIEW COMPARISON:  08/17/2021. FINDINGS: Cardiac silhouette  is unremarkable. No pneumothorax or pleural effusion. The lungs are clear. Old fractures right 6th, 7th and 8th ribs. IMPRESSION: No acute cardiopulmonary process. Electronically Signed   By: Layla Maw M.D.   On: 07/08/2023 20:16    Procedures Procedures    Medications Ordered in ED Medications - No data to display  ED Course/ Medical Decision Making/ A&P  Patient is a 52 year old female with history of hypertension presenting with elevated blood pressure as described in the HPI.  Patient arrives here initially hypertensive with blood pressure of 182/107, but at the time of my evaluation has improved to 155/93.  She does report taking an extra dose of her HCTZ and losartan prior to coming here.  Her vital signs are otherwise stable and neurologic and physical exam nonfocal.  Workup initiated here including CBC, metabolic panel, and troponin, all of which are normal.  Chest x-ray is clear.  Patient's blood pressure has improved spontaneously and I do not feel as though emergent intervention is indicated.  She is concerned about not being able to get into see her primary doctor in the next several weeks and is requesting a medication in case this happens again.  I will prescribe her a small quantity of clonidine and see if this helps.  She is to keep a record of her blood pressures and return to the emergency department if she experiences additional issues.  Final Clinical Impression(s) / ED Diagnoses Final diagnoses:  None    Rx / DC Orders ED Discharge Orders     None         Geoffery Lyons, MD 07/09/23 [phone]

## 2023-07-27 ENCOUNTER — Other Ambulatory Visit: Payer: Self-pay | Admitting: Cardiology

## 2023-07-27 DIAGNOSIS — I10 Essential (primary) hypertension: Secondary | ICD-10-CM

## 2023-08-20 ENCOUNTER — Other Ambulatory Visit: Payer: Self-pay | Admitting: Cardiology

## 2023-08-20 DIAGNOSIS — I10 Essential (primary) hypertension: Secondary | ICD-10-CM

## 2023-08-21 NOTE — Progress Notes (Unsigned)
  Cardiology Office Note   Date:  08/22/2023  ID:  Karen Dominguez, DOB [birth year], MRN [id number] PCP:  Joycelyn Rua, MD [facility] Cardiologist: Olga Millers, MD  Reason for visit: 1 year follow-up  History of Present Illness    Karen Dominguez is a 52 y.o. female with a hx of hypertension, mildly dilated TAA.  Coronary calcium score 0 in 2020.  2D echo December 2022 with EF 65%, normal diastolic function, normal RV, no significant valve disease.  Patient last saw Dr. Jens Som in October 2023.  She mentions dyspnea with more strenuous activities relieved with rest.  No chest pain.  Today, patient states that she has not always been completely compliant with her medications.  She states she was diagnosed with hypertension after having children.  She also notes undergoing menopause over the past year which increased her headaches.  She has been on hormone replacement therapy since summer.  She went to the ED on July 08, 2023 with blood pressure 184/127.  Clonidine 0.1 mg as needed for high blood pressure was prescribed.  She states blood pressure controlled overall on losartan 50 mg daily and hydrochlorothiazide 12.5 mg daily since she has been taken her medications regularly.  Typically highest blood pressure at home has been 140/90.  She mentions 1 time last week she felt bad and took a clonidine.  Otherwise, she denies chest pain, shortness of breath, palpitations, lightheadedness, syncope and lower extremity edema.  She states she exercises regularly and eats a healthy diet.  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   Objective / Physical Exam   Vital signs:  BP 112/68 (BP Location: Left Arm, Patient Position: Sitting, Cuff Size: Normal)   Pulse 68   Ht 5\' 2"  (1.575 m)   Wt 128 lb (58.1 kg)   BMI 23.41 kg/m     GEN: No acute distress NECK: No carotid bruits CARDIAC: RRR, no murmurs RESPIRATORY:  Clear to auscultation without rales, wheezing or rhonchi   EXTREMITIES: No edema  Assessment and Plan   Hypertension, well-controlled -Blood pressure well-controlled now taking medications regularly.  Will refill her medications into a combination pill.  Prescribed Hyzaar 50-12.5 mg once daily.  She has normal kidney function potassium. -Okay to take clonidine 0.1 mg for SBP> 160. -Goal BP is <130/80.  Recommend DASH diet (high in vegetables, fruits, low-fat dairy products, whole grains, poultry, fish, and nuts and low in sweets, sugar-sweetened beverages, and red meats), salt restriction and increase physical activity.  Ectatic thoracic aorta -MRA chest 11/2020: Stable ectatic but nonaneurysmal ascending thoracic aorta with a maximal diameter of 3.8 cm. -Will schedule follow-up MRA per Dr. Jens Som.  She is on a ~2-3 year schedule.  Hyperlipidemia -She has not been on therapy with no documented vascular disease. -Lipids last year total cholesterol 247, triglycerides 56 HDL 74 and LDL 164.  She thinks menopause may have affected her lipids.  Recheck lipids.  She is hesitant about starting statin therapy.  Recommend rechecking coronary calcium score.  If coronary calcium is 0, reasonable to defer on statin therapy. -Recommend cholesterol lowering diets - Mediterranean diet, DASH diet, vegetarian diet, low-carbohydrate diet and avoidance of trans fats.  Discussed healthier choice substitutes.  Nuts, high-fiber foods, and fiber supplements may also improve lipids.    Disposition - Follow-up in 1 year.    Signed, Cannon Kettle, PA-C  08/22/2023 [facility]

## 2023-08-22 ENCOUNTER — Encounter: Payer: Self-pay | Admitting: Physician Assistant

## 2023-08-22 ENCOUNTER — Ambulatory Visit: Payer: 59 | Attending: Physician Assistant | Admitting: Physician Assistant

## 2023-08-22 VITALS — BP 112/68 | HR 68 | Ht 62.0 in | Wt 128.0 lb

## 2023-08-22 DIAGNOSIS — E781 Pure hyperglyceridemia: Secondary | ICD-10-CM

## 2023-08-22 DIAGNOSIS — I7123 Aneurysm of the descending thoracic aorta, without rupture: Secondary | ICD-10-CM | POA: Diagnosis not present

## 2023-08-22 DIAGNOSIS — I10 Essential (primary) hypertension: Secondary | ICD-10-CM | POA: Diagnosis not present

## 2023-08-22 MED ORDER — LOSARTAN POTASSIUM-HCTZ 50-12.5 MG PO TABS: 1.0000 | ORAL_TABLET | Freq: Every day | ORAL | 3 refills | Status: DC

## 2023-08-22 MED ORDER — CLONIDINE HCL 0.1 MG PO TABS: 0.1000 mg | ORAL_TABLET | Freq: Two times a day (BID) | ORAL | 3 refills | Status: DC | PRN

## 2023-08-22 NOTE — Patient Instructions (Addendum)
Medication Instructions:  Start Losartan/ hydrochlorothiazide 50/12.5  mg ( Take 1 Tablet Daily). *If you need a refill on your cardiac medications before your next appointment, please call your pharmacy*   Lab Work: Lipid Panel If you have labs (blood work) drawn today and your tests are completely normal, you will receive your results only by: MyChart Message (if you have MyChart) OR A paper copy in the mail If you have any lab test that is abnormal or we need to change your treatment, we will call you to review the results.   Testing/Procedures: [facility] [employer]. Your physician has requested that you have a cardiac MRI. Cardiac MRI uses a computer to create images of your heart as its beating, producing both still and moving pictures of your heart and major blood vessels. For further information please visit [email or web address]. Please follow the instruction sheet given to you today for more information.    Juanda Crumble PA- C  has ordered a CT coronary calcium score.   Test locations:   [facility]     This is $99 out of pocket.   Coronary CalciumScan A coronary calcium scan is an imaging test used to look for deposits of calcium and other fatty materials (plaques) in the inner lining of the blood vessels of the heart (coronary arteries). These deposits of calcium and plaques can partly clog and narrow the coronary arteries without producing any symptoms or warning signs. This puts a person at risk for a heart attack. This test can detect these deposits before symptoms develop. Tell a health care provider about: Any allergies you have. All medicines you are taking, including vitamins, herbs, eye drops, creams, and over-the-counter medicines. Any problems you or family members have had with anesthetic medicines. Any blood disorders you have. Any surgeries you have had. Any medical conditions you have. Whether you are pregnant or may be pregnant. What  are the risks? Generally, this is a safe procedure. However, problems may occur, including: Harm to a pregnant woman and her unborn baby. This test involves the use of radiation. Radiation exposure can be dangerous to a pregnant woman and her unborn baby. If you are pregnant, you generally should not have this procedure done. Slight increase in the risk of cancer. This is because of the radiation involved in the test. What happens before the procedure? No preparation is needed for this procedure. What happens during the procedure? You will undress and remove any jewelry around your neck or chest. You will put on a hospital gown. Sticky electrodes will be placed on your chest. The electrodes will be connected to an electrocardiogram (ECG) machine to record a tracing of the electrical activity of your heart. A CT scanner will take pictures of your heart. During this time, you will be asked to lie still and hold your breath for 2-3 seconds while a picture of your heart is being taken. The procedure may vary among health care providers and hospitals. What happens after the procedure? You can get dressed. You can return to your normal activities. It is up to you to get the results of your test. Ask your health care provider, or the department that is doing the test, when your results will be ready. Summary A coronary calcium scan is an imaging test used to look for deposits of calcium and other fatty materials (plaques) in the inner lining of the blood vessels of the heart (coronary arteries). Generally, this is a safe procedure. Tell your health care  provider if you are pregnant or may be pregnant. No preparation is needed for this procedure. A CT scanner will take pictures of your heart. You can return to your normal activities after the scan is done. This information is not intended to replace advice given to you by your health care provider. Make sure you discuss any questions you have with your  health care provider. Document Released: 03/22/2008 Document Revised: 08/13/2016 Document Reviewed: 08/13/2016 Elsevier Interactive Patient Education  2017 [employer].   Follow-Up: At [facility], you and your health needs are our priority.  As part of our continuing mission to provide you with exceptional heart care, we have created designated Provider Care Teams.  These Care Teams include your primary Cardiologist (physician) and Advanced Practice Providers (APPs -  Physician Assistants and Nurse Practitioners) who all work together to provide you with the care you need, when you need it.  We recommend signing up for the patient portal called "MyChart".  Sign up information is provided on this After Visit Summary.  MyChart is used to connect with patients for Virtual Visits (Telemedicine).  Patients are able to view lab/test results, encounter notes, upcoming appointments, etc.  Non-urgent messages can be sent to your provider as well.   To learn more about what you can do with MyChart, go to [email or web address].    Your next appointment:   1 year(s)  Provider:   Olga Millers, MD

## 2023-09-03 ENCOUNTER — Telehealth: Payer: Self-pay

## 2023-09-03 LAB — LIPID PANEL
Chol/HDL Ratio: 3.2 {ratio} (ref 0.0–4.4)
Cholesterol, Total: 245 mg/dL — ABNORMAL HIGH (ref 100–199)
HDL: 77 mg/dL (ref >39)
LDL Chol Calc (NIH): 160 mg/dL — ABNORMAL HIGH (ref 0–99)
Triglycerides: 51 mg/dL (ref 0–149)
VLDL Cholesterol Cal: 8 mg/dL (ref 5–40)

## 2023-09-03 NOTE — Telephone Encounter (Addendum)
Called patient regarding results. Patient had understanding of results.----- Message from Cannon Kettle sent at 09/03/2023 11:17 AM EST ----- LDL (bad cholesterol) remains elevated. HDL & Triglycerides in normal/desired level.  - Plan to follow-up coronary calcium score which is scheduled .  If coronary calcium is 0, reasonable to defer on statin therapy. -Recommend cholesterol lowering diets - Mediterranean diet, DASH diet, vegetarian diet, low-carbohydrate diet and avoidance of trans fats.  Discussed healthier choice substitutes.  Nuts, high-fiber foods, and fiber supplements may also improve lipids.

## 2023-09-06 ENCOUNTER — Ambulatory Visit (HOSPITAL_BASED_OUTPATIENT_CLINIC_OR_DEPARTMENT_OTHER)
Admission: RE | Admit: 2023-09-06 | Discharge: 2023-09-06 | Disposition: A | Discharge status: Home / Self Care | Payer: Self-pay | Source: Ambulatory Visit | Attending: Physician Assistant | Admitting: Physician Assistant

## 2023-09-06 ENCOUNTER — Other Ambulatory Visit: Payer: Self-pay | Admitting: Physician Assistant

## 2023-09-06 DIAGNOSIS — I10 Essential (primary) hypertension: Secondary | ICD-10-CM | POA: Insufficient documentation

## 2023-09-06 DIAGNOSIS — E781 Pure hyperglyceridemia: Secondary | ICD-10-CM | POA: Insufficient documentation

## 2023-09-06 DIAGNOSIS — I7123 Aneurysm of the descending thoracic aorta, without rupture: Secondary | ICD-10-CM | POA: Insufficient documentation

## 2023-09-09 ENCOUNTER — Telehealth: Payer: Self-pay

## 2023-09-09 NOTE — Telephone Encounter (Addendum)
Results viewed by patient via MyChart.----- Message from Cannon Kettle sent at 09/09/2023 11:54 AM EST ----- Randie Heinz news - coronary calcium score is 0 - suggesting low risk for a cardiovascular event.  Since your coronary calcium is 0, It is reasonable to defer on statin therapy. Recommend cholesterol lowering diets - Mediterranean diet, DASH diet, vegetarian diet, low-carbohydrate diet and avoidance of trans fats.  Discussed healthier choice substitutes.  Nuts, high-fiber foods, and fiber supplements may also improve lipids.

## 2023-10-30 ENCOUNTER — Ambulatory Visit (HOSPITAL_COMMUNITY): Admission: RE | Admit: 2023-10-30 | Payer: 59 | Source: Ambulatory Visit

## 2024-10-28 ENCOUNTER — Other Ambulatory Visit: Payer: Self-pay | Admitting: Cardiology

## 2024-11-04 MED ORDER — LOSARTAN POTASSIUM-HCTZ 50-12.5 MG PO TABS: 1.0000 | ORAL_TABLET | Freq: Every day | ORAL | 0 refills | Status: AC

## 2024-11-04 NOTE — Telephone Encounter (Signed)
 In accordance with refill protocols, please review and address the following requirements before this medication refill can be authorized:  Labs
